# Patient Record
Sex: Female | Born: 2000 | Race: Black or African American | Hispanic: No | Marital: Single | Smoking: Never smoker
Health system: Southern US, Community
[De-identification: ages and names within clinical notes are randomized; demographics above are authoritative.]

## PROBLEM LIST (undated history)

## (undated) ENCOUNTER — Inpatient Hospital Stay (HOSPITAL_COMMUNITY): Payer: Self-pay

## (undated) DIAGNOSIS — Z789 Other specified health status: Secondary | ICD-10-CM

## (undated) DIAGNOSIS — F419 Anxiety disorder, unspecified: Secondary | ICD-10-CM

## (undated) DIAGNOSIS — S069XAA Unspecified intracranial injury with loss of consciousness status unknown, initial encounter: Secondary | ICD-10-CM

## (undated) DIAGNOSIS — F32A Depression, unspecified: Secondary | ICD-10-CM

## (undated) HISTORY — DX: Depression, unspecified: F32.A

## (undated) HISTORY — DX: Other specified health status: Z78.9

## (undated) HISTORY — DX: Anxiety disorder, unspecified: F41.9

## (undated) HISTORY — PX: NO PAST SURGERIES: SHX2092

---
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
## 2022-05-25 NOTE — Congregational Nurse Program (Signed)
Met with client this afternoon along with her 22 y.o. son Shanon Brow and a friend Ivin Booty who brought them here.  Ivin Booty got involved with this family several weeks ago after providing transportation for them to church.  Mom is of African descent but speaks [place] although difficult to understand her at times. Mom reports that she has a history of anxiety and depression.  States she has never taken medication for it. No other health needs noted at this time. Mom appears to have difficulty comprehending questions asked by nurse.  Her friend attempts to explain to her. Plan:  Nurse will check client's medical record to determine any other health history.  Nicolina Hirt D. Joneen Caraway MSN, Advice worker Nurse [employer] [phone]

## 2022-06-29 NOTE — Congregational Nurse Program (Signed)
Nurse stopped by client's room at the shelter to see how she and her son Shanon Brow were doing and to discuss feeding strategies.  Client states "he will just not eat anything" Nurse discussed maybe placing applesauce or some other food onto a Dorito chip to see if that helps.  Client seems really frustrated and has tried everything she states nothing works. Nurse encouraged client to be patient and gentle with Shanon Brow when coaxing him to eat and to not force it.  Client agrees and will continue to try. Client has no expressed needs of her own.  Nurse will follow up with client netx week.  Agapita Savarino D. Joneen Caraway MSN, Advice worker Nurse [employer] [phone]

## 2023-05-20 ENCOUNTER — Ambulatory Visit: Payer: Medicaid Other

## 2023-05-20 DIAGNOSIS — Z23 Encounter for immunization: Secondary | ICD-10-CM

## 2023-07-11 ENCOUNTER — Emergency Department (HOSPITAL_COMMUNITY)
Admission: EM | Admit: 2023-07-11 | Discharge: 2023-07-12 | Disposition: A | Discharge status: Home / Self Care | Payer: MEDICAID | Attending: Emergency Medicine | Admitting: Emergency Medicine

## 2023-07-11 DIAGNOSIS — R059 Cough, unspecified: Secondary | ICD-10-CM | POA: Diagnosis present

## 2023-07-11 DIAGNOSIS — J210 Acute bronchiolitis due to respiratory syncytial virus: Secondary | ICD-10-CM

## 2023-07-11 DIAGNOSIS — Z20822 Contact with and (suspected) exposure to covid-19: Secondary | ICD-10-CM | POA: Diagnosis not present

## 2023-07-11 DIAGNOSIS — B974 Respiratory syncytial virus as the cause of diseases classified elsewhere: Secondary | ICD-10-CM | POA: Insufficient documentation

## 2023-07-11 NOTE — ED Notes (Signed)
Per EMS, pt from home, c/o flu like symptoms, cough and fever X3 hours.  No OTC taken.  100/60 125HR  96%  [facility] prior to triage.

## 2023-07-11 NOTE — ED Notes (Signed)
Pt went to peds w/ child.  Will be triaged after child is taken care of.

## 2023-07-12 ENCOUNTER — Other Ambulatory Visit: Payer: Self-pay

## 2023-07-12 ENCOUNTER — Encounter (HOSPITAL_COMMUNITY): Payer: Self-pay

## 2023-07-12 LAB — RESP PANEL BY RT-PCR (RSV, FLU A&B, COVID)  RVPGX2
Influenza A by PCR: NEGATIVE
Influenza B by PCR: NEGATIVE
Resp Syncytial Virus by PCR: POSITIVE — AB
SARS Coronavirus 2 by RT PCR: NEGATIVE

## 2023-07-12 MED ORDER — ONDANSETRON 8 MG PO TBDP: 8.0000 mg | ORAL_TABLET | Freq: Three times a day (TID) | ORAL | 0 refills | Status: DC | PRN

## 2023-07-12 NOTE — Discharge Instructions (Signed)
Please take over-the-counter medicines for fever.  A prescription for nausea and spinning sent to your pharmacy. Return to the emergency department if you are having worsening symptoms, cannot tolerate fluids, become short of breath, or other new symptoms

## 2023-07-12 NOTE — ED Notes (Signed)
Pt was called x3 no answer for vitals.

## 2023-07-12 NOTE — ED Triage Notes (Signed)
 Pt POV with child in PEDS d/t flu-like s/s - cough, congestion, nausea.  Child has same s/s

## 2023-07-12 NOTE — ED Provider Notes (Signed)
 [place] EMERGENCY DEPARTMENT AT [facility] Provider Note   CSN: [id number] Arrival date & time: 07/11/23  2311     History  Chief Complaint  Patient presents with  . Cough    Teresa Baldwin is a 22 y.o. female.  HPI 22 year old female presents today with runny nose, cough, congestion, nausea.  Symptoms began last night with chills.  She has not noted any definite fever.  She has a preschool child with similar symptoms.  Denies any difficulty breathing, eating, chest pain, or dyspnea.  She is otherwise healthy and is not pregnant.    Home Medications Prior to Admission medications   Medication Sig Start Date End Date Taking? Authorizing Provider  docusate sodium  (COLACE) 100 MG capsule Take 1 capsule (100 mg total) by mouth 2 (two) times daily. 01/25/18   Degele, Mliss SQUIBB, MD  ferrous sulfate  300 (60 Fe) MG/5ML syrup Take 10 mLs (600 mg total) by mouth daily with breakfast. 11/02/17   Jerilynn Longs, NP  ferrous sulfate  300 (60 Fe) MG/5ML syrup Take 10 mLs (600 mg total) by mouth 2 (two) times daily with a meal. 12/21/17   Hipkins, Laymon SQUIBB, MD  ibuprofen  (ADVIL ,MOTRIN ) 100 MG/5ML suspension Take 30 mLs (600 mg total) by mouth every 6 (six) hours. 12/20/17   Hipkins, Laymon SQUIBB, MD  Prenatal Vit-Fe Fumarate-FA (PRENATAL MULTIVITAMIN) TABS tablet Take 1 tablet by mouth daily at 12 noon.    [provider]      Allergies    Patient has no known allergies.    Review of Systems   Review of Systems  Physical Exam Updated Vital Signs BP 99/78   Pulse (!) 104   Temp 98.5 F (36.9 C)   Resp 16   Ht 1.397 m (4' 7)   Wt 35.6 kg   LMP 07/05/2023   SpO2 97%   BMI 18.24 kg/m  Physical Exam Vitals reviewed.  HENT:     Head: Normocephalic.     Right Ear: External ear normal.     Left Ear: External ear normal.     Nose: Nose normal.     Mouth/Throat:     Pharynx: Oropharynx is clear.  Eyes:     Pupils: Pupils are equal, round, and reactive to  light.  Cardiovascular:     Rate and Rhythm: Normal rate and regular rhythm.     Pulses: Normal pulses.  Pulmonary:     Effort: Pulmonary effort is normal.     Breath sounds: Normal breath sounds.  Abdominal:     Palpations: Abdomen is soft.  Musculoskeletal:        General: Normal range of motion.     Cervical back: Normal range of motion.  Skin:    General: Skin is warm and dry.     Capillary Refill: Capillary refill takes less than 2 seconds.  Neurological:     General: No focal deficit present.     Mental Status: She is alert.  Psychiatric:        Mood and Affect: Mood normal.     ED Results / Procedures / Treatments   Labs (all labs ordered are listed, but only abnormal results are displayed) Labs Reviewed  RESP PANEL BY RT-PCR (RSV, FLU A&B, COVID)  RVPGX2 - Abnormal; Notable for the following components:      Result Value   Resp Syncytial Virus by PCR POSITIVE (*)    All other components within normal limits    EKG None  Radiology  No results found.  Procedures Procedures    Medications Ordered in ED Medications - No data to display  ED Course/ Medical Decision Making/ A&P Clinical Course as of 07/12/23 0835  Tue Jul 12, 2023  9164 Respiratory panel done and RSV positive [DR]    Clinical Course User Index [DR] Levander Houston, MD                                 Medical Decision Making  22 year old female with URI symptoms as well as some systemic symptoms.  Respiratory panel was done and patient has RSV.  She appears hemodynamically stable for discharge        Final Clinical Impression(s) / ED Diagnoses Final diagnoses:  None    Rx / DC Orders ED Discharge Orders     None         Levander Houston, MD 07/15/23 1031

## 2023-08-05 ENCOUNTER — Encounter: Payer: Self-pay | Admitting: Family Medicine

## 2023-08-05 ENCOUNTER — Ambulatory Visit: Payer: MEDICAID | Admitting: Family Medicine

## 2023-08-05 VITALS — BP 100/60 | HR 96 | Temp 98.1°F | Ht <= 58 in | Wt 121.0 lb

## 2023-08-05 DIAGNOSIS — Z7689 Persons encountering health services in other specified circumstances: Secondary | ICD-10-CM | POA: Insufficient documentation

## 2023-08-05 DIAGNOSIS — F32A Depression, unspecified: Secondary | ICD-10-CM

## 2023-08-05 DIAGNOSIS — R058 Other specified cough: Secondary | ICD-10-CM | POA: Diagnosis not present

## 2023-08-05 DIAGNOSIS — R0981 Nasal congestion: Secondary | ICD-10-CM

## 2023-08-05 DIAGNOSIS — E559 Vitamin D deficiency, unspecified: Secondary | ICD-10-CM | POA: Insufficient documentation

## 2023-08-05 DIAGNOSIS — F419 Anxiety disorder, unspecified: Secondary | ICD-10-CM

## 2023-08-05 DIAGNOSIS — F70 Mild intellectual disabilities: Secondary | ICD-10-CM

## 2023-08-05 MED ORDER — TRIAMCINOLONE ACETONIDE 40 MG/ML IJ SUSP: 40.0000 mg | Freq: Once | INTRAMUSCULAR | Status: DC

## 2023-08-05 MED ORDER — TRIAMCINOLONE ACETONIDE 40 MG/ML IJ SUSP
60.0000 mg | Freq: Once | INTRAMUSCULAR | Status: AC
  Administered 2023-08-05: 60 mg via INTRAMUSCULAR

## 2023-08-05 MED ORDER — BENZONATATE 100 MG PO CAPS: 100.0000 mg | ORAL_CAPSULE | Freq: Three times a day (TID) | ORAL | 0 refills | Status: DC | PRN

## 2023-08-05 MED ORDER — SERTRALINE HCL 25 MG PO TABS: 25.0000 mg | ORAL_TABLET | Freq: Every day | ORAL | 3 refills | Status: DC

## 2023-08-05 MED ORDER — TRIAMCINOLONE ACETONIDE 55 MCG/ACT NA AERO: 1.0000 | INHALATION_SPRAY | Freq: Two times a day (BID) | NASAL | 5 refills | Status: AC

## 2023-08-05 NOTE — Assessment & Plan Note (Signed)
Start zoloft  25 mg every day and referred to psychotherapy for counseling.

## 2023-08-05 NOTE — Progress Notes (Unsigned)
I,Jameka J Llittleton, CMA,acting as a Neurosurgeon for [employer], NP.,have documented all relevant documentation on the behalf of Ellender Hose, NP,as directed by  Ellender Hose, NP while in the presence of Ellender Hose, NP.  Subjective:  Patient ID: Teresa Baldwin , female    DOB: Aug 17, 2000 , 23 y.o.   MRN: [id number]  Chief Complaint  Patient presents with   Establish Care   URI    HPI  Patient is a 23 year old female who presents today to establish care. She would like to discuss nasal congestion and headaches.Patient also reports that she has not tried any medications for nasal congestion either, she denies any fever or body aches and states that no one around her is sick. Patient also states that she is very stressed with anxiety and depression, stating that she has a 76 year old son who is sick and on a feeding tube. Patient reports  that her family has not been very supportive of her and her child , so she moved out and had to live in a homeless shelter with her son for over 1 year before getting her own apartment, she states that she is currently unemployed.She would like to be referred for counseling and try medication for depression. Patient has a diagnosis of mild intellectual disorder, she is calm, able to answer question but constantly repeat some things.       Past Medical History:  Diagnosis Date   Anxiety    Depression    Medical history non-contributory      Family History  Problem Relation Age of Onset   Uterine cancer Mother    Hypertension Father      Current Outpatient Medications:    benzonatate (TESSALON PERLES) 100 MG capsule, Take 1 capsule (100 mg total) by mouth 3 (three) times daily as needed., Disp: 30 capsule, Rfl: 0   sertraline (ZOLOFT) 25 MG tablet, Take 1 tablet (25 mg total) by mouth daily., Disp: 30 tablet, Rfl: 3   triamcinolone (NASACORT) 55 MCG/ACT AERO nasal inhaler, Place 1 spray into the nose 2 (two) times daily., Disp: 60 each, Rfl: 5    docusate sodium (COLACE) 100 MG capsule, Take 1 capsule (100 mg total) by mouth 2 (two) times daily., Disp: 60 capsule, Rfl: 1   ferrous sulfate 300 (60 Fe) MG/5ML syrup, Take 10 mLs (600 mg total) by mouth daily with breakfast., Disp: 300 mL, Rfl: 1   ferrous sulfate 300 (60 Fe) MG/5ML syrup, Take 10 mLs (600 mg total) by mouth 2 (two) times daily with a meal., Disp: 150 mL, Rfl: 3   ondansetron (ZOFRAN-ODT) 8 MG disintegrating tablet, Take 1 tablet (8 mg total) by mouth every 8 (eight) hours as needed for nausea or vomiting., Disp: 20 tablet, Rfl: 0   Prenatal Vit-Fe Fumarate-FA (PRENATAL MULTIVITAMIN) TABS tablet, Take 1 tablet by mouth daily at 12 noon., Disp: , Rfl:    No Known Allergies   Review of Systems  Constitutional: Negative.   HENT:  Positive for congestion.   Eyes: Negative.   Respiratory:  Positive for cough.   Cardiovascular: Negative.   Genitourinary: Negative.   Musculoskeletal: Negative.   Skin: Negative.   Neurological:  Positive for headaches.  Psychiatric/Behavioral:  The patient is nervous/anxious.      Today's Vitals   08/05/23 1035 08/05/23 1115  BP: 100/60   Pulse: (!) 104 96  Temp: 98.1 F (36.7 C)   TempSrc: Oral   Weight: 121 lb (54.9 kg)  Height: 4\' 7"  (1.397 m)   PainSc: 10-Worst pain ever   PainLoc: Head    Body mass index is 28.12 kg/m.  Wt Readings from Last 3 Encounters:  08/05/23 121 lb (54.9 kg)  07/12/23 78 lb 7.7 oz (35.6 kg)  01/25/18 78 lb 8 oz (35.6 kg) (<1%, Z= -4.51)*   * Growth percentiles are based on CDC (Girls, 2-20 Years) data.    The ASCVD Risk score (Arnett DK, et al., 2019) failed to calculate for the following reasons:   The 2019 ASCVD risk score is only valid for ages 44 to 26  Objective:  Physical Exam HENT:     Head: Normocephalic.  Cardiovascular:     Rate and Rhythm: Normal rate and regular rhythm.  Pulmonary:     Effort: No respiratory distress.     Breath sounds: No rhonchi.  Abdominal:     General:  Bowel sounds are normal.  Skin:    General: Skin is warm and dry.  Neurological:     General: No focal deficit present.     Mental Status: She is alert and oriented to person, place, and time.  Psychiatric:        Mood and Affect: Mood normal.         Assessment And Plan:  Establishing care with new doctor, encounter for  Dry cough -     Benzonatate; Take 1 capsule (100 mg total) by mouth 3 (three) times daily as needed.  Dispense: 30 capsule; Refill: 0  Anxiety and depression Assessment & Plan: Start zoloft  25 mg every day and referred to psychotherapy for counseling.  Orders: -     Sertraline HCl; Take 1 tablet (25 mg total) by mouth daily.  Dispense: 30 tablet; Refill: 3 -     Ambulatory referral to Psychology -     CMP14+EGFR -     CBC with Differential/Platelet  Nasal sinus congestion -     Triamcinolone Acetonide; Place 1 spray into the nose 2 (two) times daily.  Dispense: 60 each; Refill: 5 -     Triamcinolone Acetonide  Vitamin D deficiency -     VITAMIN D 25 Hydroxy (Vit-D Deficiency, Fractures)  Intellectual developmental disorder, mild    Return in 6 weeks (on 09/16/2023), or if symptoms worsen or fail to improve, for Depression.  Patient was given opportunity to ask questions. Patient verbalized understanding of the plan and was able to repeat key elements of the plan. All questions were answered to their satisfaction.    I, Ellender Hose, NP, have reviewed all documentation for this visit. The documentation on 08/05/2023 for the exam, diagnosis, procedures, and orders are all accurate and complete.    IF YOU HAVE BEEN REFERRED TO A SPECIALIST, IT MAY TAKE 1-2 WEEKS TO SCHEDULE/PROCESS THE REFERRAL. IF YOU HAVE NOT HEARD FROM US/SPECIALIST IN TWO WEEKS, PLEASE GIVE [place] A CALL AT [phone].

## 2023-08-06 LAB — CBC WITH DIFFERENTIAL/PLATELET
Basophils Absolute: 0 10*3/uL (ref 0.0–0.2)
Basos: 0 %
EOS (ABSOLUTE): 0.1 10*3/uL (ref 0.0–0.4)
Eos: 1 %
Hematocrit: 36 % (ref 34.0–46.6)
Hemoglobin: 12 g/dL (ref 11.1–15.9)
Immature Grans (Abs): 0 10*3/uL (ref 0.0–0.1)
Immature Granulocytes: 0 %
Lymphocytes Absolute: 2.3 10*3/uL (ref 0.7–3.1)
Lymphs: 30 %
MCH: 27.8 pg (ref 26.6–33.0)
MCHC: 33.3 g/dL (ref 31.5–35.7)
MCV: 84 fL (ref 79–97)
Monocytes Absolute: 0.8 10*3/uL (ref 0.1–0.9)
Monocytes: 11 %
Neutrophils Absolute: 4.3 10*3/uL (ref 1.4–7.0)
Neutrophils: 58 %
Platelets: 309 10*3/uL (ref 150–450)
RBC: 4.31 x10E6/uL (ref 3.77–5.28)
RDW: 13 % (ref 11.7–15.4)
WBC: 7.5 10*3/uL (ref 3.4–10.8)

## 2023-08-06 LAB — CMP14+EGFR
ALT: 12 [IU]/L (ref 0–32)
AST: 20 [IU]/L (ref 0–40)
Albumin: 4.3 g/dL (ref 4.0–5.0)
Alkaline Phosphatase: 67 [IU]/L (ref 44–121)
BUN/Creatinine Ratio: 13 (ref 9–23)
BUN: 9 mg/dL (ref 6–20)
Bilirubin Total: <0.2 mg/dL (ref 0.0–1.2)
CO2: 24 mmol/L (ref 20–29)
Calcium: 9.7 mg/dL (ref 8.7–10.2)
Chloride: 98 mmol/L (ref 96–106)
Creatinine, Ser: 0.72 mg/dL (ref 0.57–1.00)
Globulin, Total: 3.6 g/dL (ref 1.5–4.5)
Glucose: 135 mg/dL — ABNORMAL HIGH (ref 70–99)
Potassium: 4 mmol/L (ref 3.5–5.2)
Sodium: 135 mmol/L (ref 134–144)
Total Protein: 7.9 g/dL (ref 6.0–8.5)
eGFR: 120 mL/min/{1.73_m2} (ref >59)

## 2023-08-06 LAB — VITAMIN D 25 HYDROXY (VIT D DEFICIENCY, FRACTURES): Vit D, 25-Hydroxy: 11.8 ng/mL — ABNORMAL LOW (ref 30.0–100.0)

## 2023-08-11 ENCOUNTER — Encounter: Payer: Self-pay | Admitting: Family Medicine

## 2023-08-11 DIAGNOSIS — F70 Mild intellectual disabilities: Secondary | ICD-10-CM | POA: Insufficient documentation

## 2023-08-11 MED ORDER — VITAMIN D (ERGOCALCIFEROL) 1.25 MG (50000 UNIT) PO CAPS: 50000.0000 [IU] | ORAL_CAPSULE | ORAL | 2 refills | Status: DC

## 2023-08-29 ENCOUNTER — Ambulatory Visit: Payer: MEDICAID | Admitting: Licensed Clinical Social Worker

## 2023-09-05 ENCOUNTER — Ambulatory Visit (INDEPENDENT_AMBULATORY_CARE_PROVIDER_SITE_OTHER): Payer: MEDICAID | Admitting: Licensed Clinical Social Worker

## 2023-09-05 DIAGNOSIS — F4329 Adjustment disorder with other symptoms: Secondary | ICD-10-CM | POA: Insufficient documentation

## 2023-09-05 DIAGNOSIS — F4323 Adjustment disorder with mixed anxiety and depressed mood: Secondary | ICD-10-CM | POA: Diagnosis not present

## 2023-09-05 NOTE — Progress Notes (Signed)
 [facility] Counselor/Therapist Progress Note  Patient ID: Teresa Baldwin, MRN: [id number]    Date: 09/05/23  Time Spent: 1100  am - 1200 am : 60 Minutes  Treatment Type: Individual Therapy.  Reported Symptoms: Symptoms of depression, anxiety, poor sleep, stress, excessive worry, financial issues and raising son alone.  Mental Status Exam: Appearance:  Disheveled     Behavior: Appropriate  Motor: Normal  Speech/Language:  Clear and Coherent  Affect: Appropriate  Mood: normal  Thought process: normal  Thought content:   WNL  Sensory/Perceptual disturbances:   WNL  Orientation: oriented to person, place, time/date, situation, day of week, month of year, and year  Attention: Good  Concentration: Good  Memory: WNL  Fund of knowledge:  Good  Insight:   Good  Judgment:  Good  Impulse Control: Fair   Risk Assessment: Danger to Self:  No Self-injurious Behavior: No Danger to Others: No Duty to Warn:no Physical Aggression / Violence:No  Access to Firearms a concern: No  Gang Involvement:No   Subjective:   Myriam Jacobson participated from office, located at [facility] with Clinician present.  Judianne consented to treatment.    Teresa Baldwin is a 23 year old [place] born female. Patient completed her [place] citizenship last year in November of 2024. Patient is here in the [place] with her son Onalee Hua who was born in the [place]. Onalee Hua is 24 years old and has medical conditions requiring him to be fed via a feeding tube. Patient reports a lack of family support although her father and sister live her in the [place]. Patient reports that her boyfriend is still in [place] and is working on trying to get here. Patient reports that one of her primary stressors is that she is a single parent raising her son on her own without her partner. Patient reports having financial issues and struggling at times to make ends meet. Patient reports seeing her Primary Doctor and keeping up with her  appointments and medications. Patient was placed on Zoloft in January to assist her with her symptoms of depression and anxiety. She states she can tell that it has decreased the severity of her symptoms. Patient is concerned about feeling better and not worrying so much and feeling overwhelmed. Patient reports that her son has a nurse that comes to the home Monday through Friday from 5 pm to 8 pm to assist with his needs. Patient reports that she would like to find a job to assist with their financial needs. Patient is pleasant and cooperative and motivated for treatment.  Interventions: Cognitive Behavioral Therapy Presenting Problem Chief Complaint: Stress from being a single parent, father is still in [place] and is working on getting to the [place]. Her parents are here but are working and she is alone raining her son.   What are the main stressors in your life right now, how long? Depression  3, Anxiety   3, Mood Swings  3, Appetite Change   3, Sleep Changes   3, Racing Thoughts   3, Loss of Interest   3, Irritability   3, Excessive Worrying   3, Low Energy   3, and Poor Concentration   3   Previous mental health services Have you ever been treated for a mental health problem, when, where, by whom? No     Are you currently seeing a therapist or counselor, counselor's name? No   Have you ever had a mental health hospitalization, how many times, length of stay? No   Have  you ever been treated with medication, name, reason, response? Yes Zoloft by PCP last month, helping but still having anxiety.  Have you ever had suicidal thoughts or attempted suicide, when, how? No NA  Risk factors for Suicide Demographic factors:  Adolescent or young adult Current mental status: No plan to harm self or others Loss factors: Financial problems/change in socioeconomic status Historical factors: NA Risk Reduction factors: Responsible for children under 68 years of age, Sense of responsibility to family,  Religious beliefs about death, and Living with another person, especially a relative Clinical factors:  Severe Anxiety and/or Agitation Depression:   NA Cognitive features that contribute to risk: NA    SUICIDE RISK:  Minimal: No identifiable suicidal ideation.  Patients presenting with no risk factors but with morbid ruminations; may be classified as minimal risk based on the severity of the depressive symptoms  Medical history Medical treatment and/or problems, explain: No NA Do you have any issues with chronic pain?  No NA Name of primary care physician/last physical exam: Dr. Dennie Bible NP January 2025  Allergies: No Medication, reactions? No medication allergies, patient reports taking medication makes her want to throw up.   Current medications:  benzonatate (TESSALON PERLES) 100 MG capsule 100 mg, 3 times daily PRN          docusate sodium (COLACE) 100 MG capsule 100 mg, 2 times daily         ferrous sulfate 300 (60 Fe) MG/5ML syrup 600 mg, Daily with breakfast         ferrous sulfate 300 (60 Fe) MG/5ML syrup 600 mg, 2 times daily with meals         ondansetron (ZOFRAN-ODT) 8 MG disintegrating tablet 8 mg, Every 8 hours PRN         Prenatal Vit-Fe Fumarate-FA (PRENATAL MULTIVITAMIN) TABS tablet 1 tablet, Daily         sertraline (ZOLOFT) 25 MG tablet 25 mg, Daily         triamcinolone (NASACORT) 55 MCG/ACT AERO nasal inhaler 1 spray, 2 times daily         Vitamin D, Ergocalciferol, (DRISDOL) 1.25 MG (50000 UNIT) CAPS capsule 50,000 Units, Every 7 days         Prescribed by: Dr. Dennie Bible NP Is there any history of mental health problems or substance abuse in your family, whom? No NA Has anyone in your family been hospitalized, who, where, length of stay? No NA  Social/family history Have you been married, how many times?  NOT married  Do you have children?  1  How many pregnancies have you had?  1  Who lives in your current household? Patient and her child-DAVID age  24.  Military history: No   Religious/spiritual involvement: [employer] What religion/faith base are you? Christian  Family of origin (childhood history)  Mom, Dad, Sister, and patient.  Where were you born? [place] Where did you grow up? [place] How many different homes have you lived? 4 Describe the atmosphere of the household where you grew up: Patient reports that it was not good. She wasn't allowed contact outside of home for safety.  Do you have siblings, step/half siblings, list names, relation, sex, age? Yes Sister-Stelle-married age 55  Are your parents separated/divorced, when and why? No Parents stayed married until mother passed away in 05/26/15.  Are your parents alive? Yes Father still alive.  Social supports (personal and professional): Patient reports that she doesn't feel that she has good support.  Education How many grades have you completed? high school diploma/GED Did you have any problems in school, what type? Yes learning difficulty Medications prescribed for these problems? No NA  Employment (financial issues): Unemployed and report financial Issues.   Legal history: No  legal issues   Trauma/Abuse history: Have you ever been exposed to any form of abuse, what type? No NA  Have you ever been exposed to something traumatic, describe? No NA  Substance use Do you use Caffeine? No Type, frequency? NA  Do you use Nicotine? No Type, frequency, ppd? NA   Do you use Alcohol? No Type, frequency? NA  How old were you went you first tasted alcohol? NA Was this accepted by your family? NA  When was your last drink, type, how much? NA  Have you ever used illicit drugs or taken more than prescribed, type, frequency, date of last usage? No NA  Diagnosis AXIS I Adjustment Disorder with Mixed Emotional Features  AXIS II No diagnosis  AXIS III @PMH @  AXIS IV economic problems, occupational problems, other psychosocial or environmental  problems, problems related to social environment, and problems with primary support group  AXIS V 51-60 moderate symptoms    Diagnosis: Adjustment Disorder with Mixed, depression and anxiety.  Individualized Treatment Plan Strengths: "I love singing and reading and meeting new people."  Supports: Patient denied having a good support system.   Goal/Needs for Treatment:  In order of importance to patient 1) "I want to feel better." 2) "I want to not be as anxious as I am." 3) "I would like to find a job."   Theatre manager of Needs: "I want to get my boyfriend here from [place] so that he can help care for our son, and I want to get a job and feel better."   Treatment Level:Moderate- Bi weekly  Symptoms:Depression, anxiety, loss of interest, poor sleep, poor appetite, excessive worry,   Client Treatment Preferences:Cognitive Behavioral Therapy   Healthcare consumer's goal for treatment:  Therapist Phyllis Ginger MSW, LCSW will support the patient's ability to achieve the goals identified. Cognitive Behavioral Therapy, Assertive Communication/Conflict Resolution Training, Relaxation Training, ACT, Humanistic and other evidenced-based practices will be used to promote progress towards healthy functioning.   Healthcare consumer will: Actively participate in therapy, working towards healthy functioning.    *Justification for Continuation/Discontinuation of Goal: R=Revised, O=Ongoing, A=Achieved, D=Discontinued  Goal 1)  Baseline date 09/05/2023: Progress towards goal "I wan't to feel better."; How Often - Daily Target Date Goal Was reviewed Status Code Progress towards goal/Likert rating  09/04/2024  O              Emotional Wellbeing: Identify and express emotions openly. Practice self-compassion and positive self-talk.  Learn healthy coping mechanisms for stress and difficult emotions.  Seek professional therapy to address underlying issues.  Physical Health: Establish a regular  sleep schedule.  Engage in regular physical activity, even if starting with short walks.  Maintain a balanced diet with nutritious foods.  Limit alcohol and substance use.  Social Connection: Reconnect with supportive friends and family members.  Consider joining a support group for people going through divorce.  Gradually expand social activities as comfort level allows.  Personal Growth: Explore new hobbies and interests.  Set small, achievable goals for personal development.  Learn stress management techniques like meditation or deep breathing.  Develop a positive outlook on the future.  Important considerations: Be patient with yourself: Healing takes time, and there will be ups and downs.  Seek professional help: Therapy can be crucial for managing depression and anxiety after a divorce.  Prioritize self-care: Make time for activities that nourish your mind and body.  Set realistic goals: Break down larger goals into smaller, manageable steps.  Communicate your needs: Let loved ones know how they can support you.   Goal 2)  Baseline date 09/05/2023: Progress towards goal "I don't wan to be as anxious as I am." ; How Often - Daily Target Date Goal Was reviewed Status Code Progress towards goal  09/04/2024  O             dentify and challenge negative thoughts: Try to find new, positive ways to look at a situation.  Practice self-compassion: Be kind to yourself and remember that you deserve to be nurtured.  Plan worry time: Set aside time each day to think about your anxieties or write them down.  Practice relaxation techniques: Try diaphragmatic breathing, simple stretches, guided imagery, or muscle tension release.  Exercise: Regular exercise can help reduce anxiety symptoms.  Eat well: Eat healthy, regular meals.  Get enough sleep: Stick to a sleep routine and avoid excess caffeine.  Spend time with others: Spend time with family and friends or do activities you enjoy.  Try the  butterfly hug: This technique combines breathing and bilateral stimulation to help lower anxiety.  This plan has been reviewed and created by the following participants:  This plan will be reviewed at least every 12 months. Goal 3)  Baseline date 2/24/205: Progress towards goal "I would like to find a job."; How Often - Daily Target Date Goal Was reviewed Status Code Progress towards goal  09/04/2024  O             Self-awareness and exploration: Identifying your strengths, weaknesses, and values to find a career fit.  Exploring past work experiences to understand what you enjoy and dislike about different roles.  Assessing your transferable skills and potential career paths.  Managing anxiety and stress: Developing coping mechanisms to manage job search related stress, like rejection, interview nerves, and self-doubt.  Identifying triggers that exacerbate anxiety and learning strategies to manage them.  Practicing relaxation techniques to alleviate stress  Communication skills development: Improving verbal and non-verbal communication skills for interviews  Crafting effective resumes and cover letters  Practicing responses to common interview questions  Building confidence and self-esteem: Challenging negative self-talk and beliefs about your ability to find a job  Identifying and addressing imposter syndrome  Developing a positive self-image to project confidence during the job search  Career decision making: Exploring different career options and making informed choices  Setting realistic job goals and developing a plan to achieve them  Identifying potential barriers to employment and developing strategies to overcome them  Addressing specific challenges: Managing mental health conditions that may impact job seeking  Dealing with past work experiences that might affect current job search  Addressing challenges related to networking or self-promotion  Date Behavioral Health Clinician  Date Guardian/Patient   09/05/2023  Phyllis Ginger MSW, LCSW 09/05/2023 Verbal Consent Provided    Phyllis Ginger MSW, LCSW/DATE 09/05/2023

## 2023-09-13 ENCOUNTER — Ambulatory Visit: Payer: MEDICAID | Admitting: Licensed Clinical Social Worker

## 2023-09-13 DIAGNOSIS — F4323 Adjustment disorder with mixed anxiety and depressed mood: Secondary | ICD-10-CM | POA: Diagnosis not present

## 2023-09-13 DIAGNOSIS — F4329 Adjustment disorder with other symptoms: Secondary | ICD-10-CM

## 2023-09-13 NOTE — Progress Notes (Signed)
 [facility] Counselor/Therapist Progress Note  Patient ID: Teresa Baldwin, MRN: [id number]    Date: 09/13/23  Time Spent: 1030  am - 1130 am : 60 Minutes  Treatment Type: Individual Therapy.  Reported Symptoms: Symptoms of depression, anxiety, poor sleep, stress, excessive worry, financial issues and raising son alone.   Mental Status Exam: Appearance:  Disheveled     Behavior: Appropriate  Motor: Normal  Speech/Language:  Clear and Coherent  Affect: Appropriate  Mood: normal  Thought process: normal  Thought content:   WNL  Sensory/Perceptual disturbances:   WNL  Orientation: oriented to person, place, time/date, situation, day of week, month of year, and year  Attention: Good  Concentration: Good  Memory: WNL  Fund of knowledge:  Good  Insight:   Good  Judgment:  Good  Impulse Control: Fair    Risk Assessment: Danger to Self:  No Self-injurious Behavior: No Danger to Others: No Duty to Warn:no Physical Aggression / Violence:No  Access to Firearms a concern: No  Gang Involvement:No    Subjective:    Teresa Baldwin participated from office, located at [facility] with Clinician present.  Teresa Baldwin consented to treatment.    Patient presented for her session early. She came and sat in the waiting area due to having to get a ride from her insurance company. Clinician called patient in early due to a cancellation. Patient remains concerned about her stress and her missing her partner. Patient reports that being a single parent is difficult and her son misses his father. Patient reports that she visited her father this past week. She said she walked to his home which is just a few streets away. Patient reports that the nice weather allowed for her and her son to get out and walk. Patient reports a lack of social support and how that leaves her feeling lonely.  Clinician validated patients feelings via active listening and verbal feedback. Clinician  discussed coping skills such as self-care while her son is at school and doing something that makes her happy. Clinician encouraged patient to make contact with some of the local organizations that Clinician provided information on during this session. Clinician provided information to the  [employer] here in [place], as well as [employer] Clinician processed with patient that they may be able to provide assistance for patient and her child as well as information on helping her partner get here to the [place]. Clinician also pointed out that this would be a good place to build some relationships.  Patient agreed to look over the information and continue to work on her coping skills to assist her in reaching her goals. Patient was pleasant and cooperative and engaged in session discussion.Patient is eager to gain tools to assist her in being a good parent and partner to her partner from [place].     Diagnosis: Adjustment Disorder with Mixed, depression and anxiety.   Individualized Treatment Plan Strengths: "I love singing and reading and meeting new people."  Supports: Patient denied having a good support system.    Goal/Needs for Treatment:  In order of importance to patient 1) "I want to feel better." 2) "I want to not be as anxious as I am." 3) "I would like to find a job."    Theatre manager of Needs: "I want to get my boyfriend here from [place] so that he can help care for our son, and I want to get a job and feel better."    Treatment Level:Moderate-  Bi weekly  Symptoms:Depression, anxiety, loss of interest, poor sleep, poor appetite, excessive worry,   Client Treatment Preferences:Cognitive Behavioral Therapy    Healthcare consumer's goal for treatment:   Therapist Phyllis Ginger MSW, LCSW will support the patient's ability to achieve the goals identified. Cognitive Behavioral Therapy, Assertive Communication/Conflict Resolution Training, Relaxation  Training, ACT, Humanistic and other evidenced-based practices will be used to promote progress towards healthy functioning.    Healthcare consumer will: Actively participate in therapy, working towards healthy functioning.     *Justification for Continuation/Discontinuation of Goal: R=Revised, O=Ongoing, A=Achieved, D=Discontinued   Goal 1)  Baseline date 09/05/2023: Progress towards goal "I wan't to feel better."; How Often - Daily Target Date Goal Was reviewed Status Code Progress towards goal/Likert rating  09/04/2024   O                       Emotional Wellbeing: Identify and express emotions openly. Practice self-compassion and positive self-talk.  Learn healthy coping mechanisms for stress and difficult emotions.  Seek professional therapy to address underlying issues.  Physical Health: Establish a regular sleep schedule.  Engage in regular physical activity, even if starting with short walks.  Maintain a balanced diet with nutritious foods.  Limit alcohol and substance use.  Social Connection: Reconnect with supportive friends and family members.  Consider joining a support group for people going through divorce.  Gradually expand social activities as comfort level allows.  Personal Growth: Explore new hobbies and interests.  Set small, achievable goals for personal development.  Learn stress management techniques like meditation or deep breathing.  Develop a positive outlook on the future.  Important considerations: Be patient with yourself: Healing takes time, and there will be ups and downs.  Seek professional help: Therapy can be crucial for managing depression and anxiety after a divorce.  Prioritize self-care: Make time for activities that nourish your mind and body.  Set realistic goals: Break down larger goals into smaller, manageable steps.  Communicate your needs: Let loved ones know how they can support you.    Goal 2)  Baseline date 09/05/2023: Progress towards  goal "I don't wan to be as anxious as I am." ; How Often - Daily Target Date Goal Was reviewed Status Code Progress towards goal  09/04/2024   O                      dentify and challenge negative thoughts: Try to find new, positive ways to look at a situation.  Practice self-compassion: Be kind to yourself and remember that you deserve to be nurtured.  Plan worry time: Set aside time each day to think about your anxieties or write them down.  Practice relaxation techniques: Try diaphragmatic breathing, simple stretches, guided imagery, or muscle tension release.  Exercise: Regular exercise can help reduce anxiety symptoms.  Eat well: Eat healthy, regular meals.  Get enough sleep: Stick to a sleep routine and avoid excess caffeine.  Spend time with others: Spend time with family and friends or do activities you enjoy.  Try the butterfly hug: This technique combines breathing and bilateral stimulation to help lower anxiety.  This plan has been reviewed and created by the following participants:  This plan will be reviewed at least every 12 months. Goal 3)  Baseline date 2/24/205: Progress towards goal "I would like to find a job."; How Often - Daily Target Date Goal Was reviewed Status Code Progress towards goal  09/04/2024  O                      Self-awareness and exploration: Identifying your strengths, weaknesses, and values to find a career fit.  Exploring past work experiences to understand what you enjoy and dislike about different roles.  Assessing your transferable skills and potential career paths.  Managing anxiety and stress: Developing coping mechanisms to manage job search related stress, like rejection, interview nerves, and self-doubt.  Identifying triggers that exacerbate anxiety and learning strategies to manage them.  Practicing relaxation techniques to alleviate stress  Communication skills development: Improving verbal and non-verbal communication skills for  interviews  Crafting effective resumes and cover letters  Practicing responses to common interview questions  Building confidence and self-esteem: Challenging negative self-talk and beliefs about your ability to find a job  Identifying and addressing imposter syndrome  Developing a positive self-image to project confidence during the job search  Career decision making: Exploring different career options and making informed choices  Setting realistic job goals and developing a plan to achieve them  Identifying potential barriers to employment and developing strategies to overcome them  Addressing specific challenges: Managing mental health conditions that may impact job seeking  Dealing with past work experiences that might affect current job search  Addressing challenges related to networking or self-promotion  Date Behavioral Health Clinician Date Guardian/Patient   09/05/2023             Phyllis Ginger MSW, LCSW 09/05/2023 Verbal Consent Provided     Interventions: Cognitive Behavioral Therapy and Solution-Oriented/Positive Psychology  Phyllis Ginger MSW, LCSW/DATE 09/13/2023

## 2023-09-16 ENCOUNTER — Ambulatory Visit: Payer: MEDICAID | Admitting: Family Medicine

## 2023-09-16 ENCOUNTER — Encounter: Payer: Self-pay | Admitting: Family Medicine

## 2023-09-16 VITALS — BP 110/70 | HR 89 | Ht <= 58 in | Wt 126.0 lb

## 2023-09-16 DIAGNOSIS — F70 Mild intellectual disabilities: Secondary | ICD-10-CM | POA: Diagnosis not present

## 2023-09-16 DIAGNOSIS — F331 Major depressive disorder, recurrent, moderate: Secondary | ICD-10-CM | POA: Diagnosis not present

## 2023-09-16 DIAGNOSIS — E559 Vitamin D deficiency, unspecified: Secondary | ICD-10-CM | POA: Diagnosis not present

## 2023-09-16 MED ORDER — VITAMIN D (ERGOCALCIFEROL) 1.25 MG (50000 UNIT) PO CAPS: 50000.0000 [IU] | ORAL_CAPSULE | ORAL | 2 refills | Status: AC

## 2023-09-16 MED ORDER — SERTRALINE HCL 50 MG PO TABS: 50.0000 mg | ORAL_TABLET | Freq: Every day | ORAL | 2 refills | Status: AC
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
## 2023-09-19 ENCOUNTER — Ambulatory Visit: Payer: MEDICAID | Admitting: Licensed Clinical Social Worker

## 2023-09-19 DIAGNOSIS — F4329 Adjustment disorder with other symptoms: Secondary | ICD-10-CM

## 2023-09-19 DIAGNOSIS — F4323 Adjustment disorder with mixed anxiety and depressed mood: Secondary | ICD-10-CM | POA: Diagnosis not present

## 2023-09-19 NOTE — Progress Notes (Signed)
 [facility] Counselor/Therapist Progress Note  Patient ID: Teresa Baldwin, MRN: [id number]    Date: 09/19/23  Time Spent: 1004  am - 1047 am : 43 Minutes  Treatment Type: Individual Therapy.  Reported Symptoms:  Symptoms of depression, anxiety, poor sleep, stress, excessive worry, financial issues and raising son alone.   Mental Status Exam: Appearance:  Disheveled     Behavior: Appropriate  Motor: Normal  Speech/Language:  Clear and Coherent  Affect: Appropriate  Mood: normal  Thought process: normal  Thought content:   WNL  Sensory/Perceptual disturbances:   WNL  Orientation: oriented to person, place, time/date, situation, day of week, month of year, and year  Attention: Good  Concentration: Good  Memory: WNL  Fund of knowledge:  Good  Insight:   Good  Judgment:  Good  Impulse Control: Fair    Risk Assessment: Danger to Self:  No Self-injurious Behavior: No Danger to Others: No Duty to Warn:no Physical Aggression / Violence:No  Access to Firearms a concern: No  Gang Involvement:No    Subjective:    Teresa Baldwin participated from office, located at [facility] with Clinician present.  Teresa Baldwin consented to treatment.    Teresa Baldwin presented for her session smiling and in a positive mood. Golden continues to be concerned about her babies father getting to [place] from [place]. Patient states that she has an interview at her old job and they said she was a good employee and she is eligible for rehire. Patient states she is hopeful of going back to work. She states that her boyfriend can be sponsored maybe by her if she is working. Patient is concerned about being here alone with her child.  Clinician actively listened and provided support to patient via verbal interaction. Clinician encouraged patient to reach Clinician encouraged patient to make contact with some of the local organizations that Clinician provided information on. Clinician  provided information to the [place] [employer] here in [place], as well as [employer] Clinician encouraged patient to utilize coping skills to assist her with reaching her goals.   Patient agreed to continue to reach out to the organizations for resources. Patient states she is going to the job interview. Patient identified that she needs her child's father here for both her son and herself for support. She states she wants to reach her goals and agrees to utilize coping skills.  Additional Coping Skills discussed: Stay Connected: Regular communication through video calls, messaging apps, and social media can help maintain relationships. Scheduling regular catch-ups can make you feel closer. Build a Scientist, clinical (histocompatibility and immunogenetics): Establishing friendships and connections in your new location can provide emotional support. Join local clubs, groups, or communities that share your interests. Create a Routine: Having a structured daily routine can provide a sense of normalcy and stability, which can be comforting when you're away from home. Explore Your New Environment: Engaging with your new surroundings can help distract from feelings of homesickness. Discover local culture, food, and activities to immerse yourself in your new life. Practice Self-Care: Prioritize your mental and physical well-being. Exercise, eat well, and take time for hobbies or activities that bring you joy. Reflect on Your Feelings: Acknowledge any feelings of loneliness or homesickness. Writing in a journal or talking to someone about your feelings can provide relief and clarity. Plan Visits: If possible, plan visits back home or invite family to visit you. Having something to look forward to can ease feelings of separation. Focus on the Positives: Remind yourself of the  opportunities and experiences you're gaining by living [place]. This perspective can help shift your mindset from what you're missing to what you're gaining.    Interventions: Cognitive Behavioral Therapy and Solution-Oriented/Positive Psychology  Diagnosis: Adjustment Disorder with mixed emotions  Diagnosis: Adjustment Disorder with Mixed, depression and anxiety.   Individualized Treatment Plan Strengths: "I love singing and reading and meeting new people."  Supports: Patient denied having a good support system.    Goal/Needs for Treatment:  In order of importance to patient 1) "I want to feel better." 2) "I want to not be as anxious as I am." 3) "I would like to find a job."    Theatre manager of Needs: "I want to get my boyfriend here from [place] so that he can help care for our son, and I want to get a job and feel better."    Treatment Level:Moderate- Bi weekly  Symptoms:Depression, anxiety, loss of interest, poor sleep, poor appetite, excessive worry,   Client Treatment Preferences:Cognitive Behavioral Therapy    Healthcare consumer's goal for treatment:   Therapist Phyllis Ginger MSW, LCSW will support the patient's ability to achieve the goals identified. Cognitive Behavioral Therapy, Assertive Communication/Conflict Resolution Training, Relaxation Training, ACT, Humanistic and other evidenced-based practices will be used to promote progress towards healthy functioning.    Healthcare consumer will: Actively participate in therapy, working towards healthy functioning.     *Justification for Continuation/Discontinuation of Goal: R=Revised, O=Ongoing, A=Achieved, D=Discontinued   Goal 1)  Baseline date 09/05/2023: Progress towards goal "I wan't to feel better."; How Often - Daily Target Date Goal Was reviewed Status Code Progress towards goal/Likert rating  09/04/2024   O                       Emotional Wellbeing: Identify and express emotions openly. Practice self-compassion and positive self-talk.  Learn healthy coping mechanisms for stress and difficult emotions.  Seek professional therapy to address underlying issues.   Physical Health: Establish a regular sleep schedule.  Engage in regular physical activity, even if starting with short walks.  Maintain a balanced diet with nutritious foods.  Limit alcohol and substance use.  Social Connection: Reconnect with supportive friends and family members.  Consider joining a support group for people going through divorce.  Gradually expand social activities as comfort level allows.  Personal Growth: Explore new hobbies and interests.  Set small, achievable goals for personal development.  Learn stress management techniques like meditation or deep breathing.  Develop a positive outlook on the future.  Important considerations: Be patient with yourself: Healing takes time, and there will be ups and downs.  Seek professional help: Therapy can be crucial for managing depression and anxiety after a divorce.  Prioritize self-care: Make time for activities that nourish your mind and body.  Set realistic goals: Break down larger goals into smaller, manageable steps.  Communicate your needs: Let loved ones know how they can support you.    Goal 2)  Baseline date 09/05/2023: Progress towards goal "I don't wan to be as anxious as I am." ; How Often - Daily Target Date Goal Was reviewed Status Code Progress towards goal  09/04/2024   O                      dentify and challenge negative thoughts: Try to find new, positive ways to look at a situation.  Practice self-compassion: Be kind to yourself and remember that you deserve to be nurtured.  Plan worry time: Set aside time each day to think about your anxieties or write them down.  Practice relaxation techniques: Try diaphragmatic breathing, simple stretches, guided imagery, or muscle tension release.  Exercise: Regular exercise can help reduce anxiety symptoms.  Eat well: Eat healthy, regular meals.  Get enough sleep: Stick to a sleep routine and avoid excess caffeine.  Spend time with others: Spend time with family  and friends or do activities you enjoy.  Try the butterfly hug: This technique combines breathing and bilateral stimulation to help lower anxiety.  This plan has been reviewed and created by the following participants:  This plan will be reviewed at least every 12 months. Goal 3)  Baseline date 2/24/205: Progress towards goal "I would like to find a job."; How Often - Daily Target Date Goal Was reviewed Status Code Progress towards goal  09/04/2024   O                      Self-awareness and exploration: Identifying your strengths, weaknesses, and values to find a career fit.  Exploring past work experiences to understand what you enjoy and dislike about different roles.  Assessing your transferable skills and potential career paths.  Managing anxiety and stress: Developing coping mechanisms to manage job search related stress, like rejection, interview nerves, and self-doubt.  Identifying triggers that exacerbate anxiety and learning strategies to manage them.  Practicing relaxation techniques to alleviate stress  Communication skills development: Improving verbal and non-verbal communication skills for interviews  Crafting effective resumes and cover letters  Practicing responses to common interview questions  Building confidence and self-esteem: Challenging negative self-talk and beliefs about your ability to find a job  Identifying and addressing imposter syndrome  Developing a positive self-image to project confidence during the job search  Career decision making: Exploring different career options and making informed choices  Setting realistic job goals and developing a plan to achieve them  Identifying potential barriers to employment and developing strategies to overcome them  Addressing specific challenges: Managing mental health conditions that may impact job seeking  Dealing with past work experiences that might affect current job search  Addressing challenges related to  networking or self-promotion  Date Behavioral Health Clinician Date Guardian/Patient   09/05/2023             Phyllis Ginger MSW, LCSW 09/05/2023 Verbal Consent Provided        Phyllis Ginger MSW, LCSW/DATE 09/19/2023

## 2023-09-25 NOTE — Assessment & Plan Note (Signed)
 Increase Sertraline to 50 mg every day.

## 2023-09-25 NOTE — Assessment & Plan Note (Signed)
 Speak to patient slowly and in simple language

## 2023-09-25 NOTE — Assessment & Plan Note (Signed)
 Take vit D supplement once Q weekly by mouth.

## 2023-09-27 ENCOUNTER — Ambulatory Visit: Payer: MEDICAID | Admitting: Licensed Clinical Social Worker

## 2023-09-27 DIAGNOSIS — F4323 Adjustment disorder with mixed anxiety and depressed mood: Secondary | ICD-10-CM | POA: Diagnosis not present

## 2023-09-27 DIAGNOSIS — F4329 Adjustment disorder with other symptoms: Secondary | ICD-10-CM

## 2023-09-27 DIAGNOSIS — F4325 Adjustment disorder with mixed disturbance of emotions and conduct: Secondary | ICD-10-CM | POA: Diagnosis not present

## 2023-09-27 NOTE — Progress Notes (Signed)
 [facility] Counselor/Therapist Progress Note  Patient ID: Teresa Baldwin, MRN: [id number]    Date: 09/27/23  Time Spent: 1104 am - 1135am : 31 Minutes  Treatment Type: Individual Therapy. Reported Symptoms:  Symptoms of depression, anxiety, poor sleep, stress, excessive worry, financial issues and raising son alone.   Mental Status Exam: Appearance:  Disheveled     Behavior: Appropriate  Motor: Normal  Speech/Language:  Clear and Coherent  Affect: Appropriate  Mood: normal  Thought process: normal  Thought content:   WNL  Sensory/Perceptual disturbances:   WNL  Orientation: oriented to person, place, time/date, situation, day of week, month of year, and year  Attention: Good  Concentration: Good  Memory: WNL  Fund of knowledge:  Good  Insight:   Good  Judgment:  Good  Impulse Control: Fair    Risk Assessment: Danger to Self:  No Self-injurious Behavior: No Danger to Others: No Duty to Warn:no Physical Aggression / Violence:No  Access to Firearms a concern: No  Gang Involvement:No    Subjective:    Teresa Baldwin participated from office, located at [facility] with Clinician present.  Teresa Baldwin consented to treatment.    Teresa Baldwin presented for her session reporting that the [place] [employer] here in [place] states that they would assist her in getting the documents for her fiancee to come but he would need to find a sponsor. Patient was preoccupied with the idea of getting her fiancee sponsored. Patient ask Clinician to find him a sponsor. Clinician reminded patient that she had provided resources but could not directly sponsor or  ask someone to sponsor him. Clinician encouraged patient to discuss with her family that is here and have them assist her in developing a plan for sponsorship. Clinician also encouraged patient to look for work so that she could possibly serve as his sponsor.  Patient appeared disappointed and wanted  Clinician to speak with her fiancee. Clinician informed patient that it would be better for her to share her information with her fiancee as Clinician was her therapist and could only provide her with resources and not be involved in getting her fiancee to the [place]. Patient did agree to speak with the [place] organization again and continue to work with them to gain additional insight into what she can do to ger her fiancee to the [place] as well as check if they can assist her in finding employment. Patient will continue to utilize coping skills. Patient will continue with weekly therapy sessions and treatment plan will be reviewed by 09/04/2024.  Additional Coping Skills discussed: Stay Connected: Regular communication through video calls, messaging apps, and social media can help maintain relationships. Scheduling regular catch-ups can make you feel closer. Build a Scientist, clinical (histocompatibility and immunogenetics): Establishing friendships and connections in your new location can provide emotional support. Join local clubs, groups, or communities that share your interests. Create a Routine: Having a structured daily routine can provide a sense of normalcy and stability, which can be comforting when you're away from home. Explore Your New Environment: Engaging with your new surroundings can help distract from feelings of homesickness. Discover local culture, food, and activities to immerse yourself in your new life. Practice Self-Care: Prioritize your mental and physical well-being. Exercise, eat well, and take time for hobbies or activities that bring you joy. Reflect on Your Feelings: Acknowledge any feelings of loneliness or homesickness. Writing in a journal or talking to someone about your feelings can provide relief and clarity. Plan Visits: If possible, plan visits back home  or invite family to visit you. Having something to look forward to can ease feelings of separation. Focus on the Positives: Remind yourself of the opportunities and  experiences you're gaining by living abroad. This perspective can help shift your mindset from what you're missing to what you're gaining.   Interventions: Cognitive Behavioral Therapy and Solution-Oriented/Positive Psychology   Diagnosis: Adjustment Disorder with mixed emotions   Diagnosis: Adjustment Disorder with Mixed, depression and anxiety.   Individualized Treatment Plan Strengths: "I love singing and reading and meeting new people."  Supports: Patient denied having a good support system.    Goal/Needs for Treatment:  In order of importance to patient 1) "I want to feel better." 2) "I want to not be as anxious as I am." 3) "I would like to find a job."    Theatre manager of Needs: "I want to get my boyfriend here from [place] so that he can help care for our son, and I want to get a job and feel better."    Treatment Level:Moderate- Bi weekly  Symptoms:Depression, anxiety, loss of interest, poor sleep, poor appetite, excessive worry,   Client Treatment Preferences:Cognitive Behavioral Therapy    Healthcare consumer's goal for treatment:   Therapist Phyllis Ginger MSW, LCSW will support the patient's ability to achieve the goals identified. Cognitive Behavioral Therapy, Assertive Communication/Conflict Resolution Training, Relaxation Training, ACT, Humanistic and other evidenced-based practices will be used to promote progress towards healthy functioning.    Healthcare consumer will: Actively participate in therapy, working towards healthy functioning.     *Justification for Continuation/Discontinuation of Goal: R=Revised, O=Ongoing, A=Achieved, D=Discontinued   Goal 1)  Baseline date 09/05/2023: Progress towards goal "I wan't to feel better."; How Often - Daily Target Date Goal Was reviewed Status Code Progress towards goal/Likert rating  09/04/2024   O                       Emotional Wellbeing: Identify and express emotions openly. Practice self-compassion and positive  self-talk.  Learn healthy coping mechanisms for stress and difficult emotions.  Seek professional therapy to address underlying issues.  Physical Health: Establish a regular sleep schedule.  Engage in regular physical activity, even if starting with short walks.  Maintain a balanced diet with nutritious foods.  Limit alcohol and substance use.  Social Connection: Reconnect with supportive friends and family members.  Consider joining a support group for people going through divorce.  Gradually expand social activities as comfort level allows.  Personal Growth: Explore new hobbies and interests.  Set small, achievable goals for personal development.  Learn stress management techniques like meditation or deep breathing.  Develop a positive outlook on the future.  Important considerations: Be patient with yourself: Healing takes time, and there will be ups and downs.  Seek professional help: Therapy can be crucial for managing depression and anxiety after a divorce.  Prioritize self-care: Make time for activities that nourish your mind and body.  Set realistic goals: Break down larger goals into smaller, manageable steps.  Communicate your needs: Let loved ones know how they can support you.    Goal 2)  Baseline date 09/05/2023: Progress towards goal "I don't wan to be as anxious as I am." ; How Often - Daily Target Date Goal Was reviewed Status Code Progress towards goal  09/04/2024   O                      dentify and challenge negative  thoughts: Try to find new, positive ways to look at a situation.  Practice self-compassion: Be kind to yourself and remember that you deserve to be nurtured.  Plan worry time: Set aside time each day to think about your anxieties or write them down.  Practice relaxation techniques: Try diaphragmatic breathing, simple stretches, guided imagery, or muscle tension release.  Exercise: Regular exercise can help reduce anxiety symptoms.  Eat well: Eat  healthy, regular meals.  Get enough sleep: Stick to a sleep routine and avoid excess caffeine.  Spend time with others: Spend time with family and friends or do activities you enjoy.  Try the butterfly hug: This technique combines breathing and bilateral stimulation to help lower anxiety.  This plan has been reviewed and created by the following participants:  This plan will be reviewed at least every 12 months. Goal 3)  Baseline date 2/24/205: Progress towards goal "I would like to find a job."; How Often - Daily Target Date Goal Was reviewed Status Code Progress towards goal  09/04/2024   O                      Self-awareness and exploration: Identifying your strengths, weaknesses, and values to find a career fit.  Exploring past work experiences to understand what you enjoy and dislike about different roles.  Assessing your transferable skills and potential career paths.  Managing anxiety and stress: Developing coping mechanisms to manage job search related stress, like rejection, interview nerves, and self-doubt.  Identifying triggers that exacerbate anxiety and learning strategies to manage them.  Practicing relaxation techniques to alleviate stress  Communication skills development: Improving verbal and non-verbal communication skills for interviews  Crafting effective resumes and cover letters  Practicing responses to common interview questions  Building confidence and self-esteem: Challenging negative self-talk and beliefs about your ability to find a job  Identifying and addressing imposter syndrome  Developing a positive self-image to project confidence during the job search  Career decision making: Exploring different career options and making informed choices  Setting realistic job goals and developing a plan to achieve them  Identifying potential barriers to employment and developing strategies to overcome them  Addressing specific challenges: Managing mental health  conditions that may impact job seeking  Dealing with past work experiences that might affect current job search  Addressing challenges related to networking or self-promotion  Date Behavioral Health Clinician Date Guardian/Patient   09/05/2023             Phyllis Ginger MSW, LCSW 09/05/2023 Verbal Consent Provided           Phyllis Ginger MSW, LCSW/DATE 09/27/2023

## 2023-10-06 ENCOUNTER — Ambulatory Visit: Payer: MEDICAID | Admitting: Licensed Clinical Social Worker

## 2023-10-10 ENCOUNTER — Ambulatory Visit: Payer: MEDICAID | Admitting: Licensed Clinical Social Worker

## 2023-10-25 ENCOUNTER — Ambulatory Visit: Payer: MEDICAID | Admitting: Licensed Clinical Social Worker

## 2023-11-07 ENCOUNTER — Ambulatory Visit: Payer: MEDICAID | Admitting: Licensed Clinical Social Worker

## 2023-11-29 ENCOUNTER — Ambulatory Visit (INDEPENDENT_AMBULATORY_CARE_PROVIDER_SITE_OTHER): Payer: MEDICAID | Admitting: Licensed Clinical Social Worker

## 2023-11-29 DIAGNOSIS — F341 Dysthymic disorder: Secondary | ICD-10-CM | POA: Diagnosis not present

## 2023-11-29 DIAGNOSIS — F419 Anxiety disorder, unspecified: Secondary | ICD-10-CM | POA: Diagnosis not present

## 2023-11-29 NOTE — Progress Notes (Addendum)
 [facility] Counselor Initial Adult Exam  Name: Teresa Baldwin Date: 11/29/2023 MRN: [id number] DOB: 2000-11-25 PCP: Melodie Spry, NP  Time Spent: 10:15  am - 10:57 am : 42 Minutes  Guardian/Payee:  Self/Adult    Paperwork requested: No   Reason for Visit /Presenting Problem: some anxiety and depression being managed with medication  Mental Status Exam: Appearance:   Well Groomed     Behavior:  Sharing  Motor:  Normal  Speech/Language:   Normal Rate  Affect:  Appropriate  Mood:  normal  Thought process:  normal  Thought content:    WNL  Sensory/Perceptual disturbances:    WNL  Orientation:  oriented to person, place, and time/date  Attention:  Good  Concentration:  Good  Memory:  WNL  Fund of knowledge:   Good  Insight:    Good  Judgment:   Good  Impulse Control:  Good   Reported Symptoms:  some anxiety and managing depression for the last two years, single mother weight of responsibility   Risk Assessment: Danger to Self:  No Self-injurious Behavior: No Danger to Others: No Duty to Warn:no Physical Aggression / Violence:No  Access to Firearms a concern: No  Gang Involvement:No  Patient / guardian was educated about steps to take if suicide or homicide risk level increases between visits: yes While future psychiatric events cannot be accurately predicted, the patient does not currently require acute inpatient psychiatric care and does not currently meet [place]  involuntary commitment criteria.  Substance Abuse History: Current substance abuse: No     Caffeine: Tobacco: Alcohol: Substance use:  Past Psychiatric History:   Previous psychological history is significant for anxiety and depression Outpatient Providers:Taking meds for anxiety and depression for the last two years, and seeing [facility] at [facility] History of Psych Hospitalization: No  Psychological Testing: Memory:  Head injury and diagnosed in [place] as a child   Abuse History:   Victim of: No., N/A   Report needed: No. Victim of Neglect:No. Perpetrator of None  Witness / Exposure to Domestic Violence: No   Protective Services Involvement: Yes  For her son CPS call but no incident case closed about two years ago Witness to [employer] Violence:  No   Family History:  Family History  Problem Relation Age of Onset   Uterine cancer Mother    Hypertension Father     Living situation: the patient lives alone  Sexual Orientation: Straight  Relationship Status: single  Name of spouse / other:N/A If a parent, number of children / ages:5  Support Systems: lives alone  Surveyor, quantity Stress:  Yes   Income/Employment/Disability: No income  Financial planner: No   Educational History: Education: high school diploma/GED  Religion/Sprituality/World View: Christian  Any cultural differences that may affect / interfere with treatment:  not applicable   Recreation/Hobbies: music and reading   Stressors: Financial difficulties   Health problems   Marital or family conflict  with family members here based on cultural   Strengths: Spirituality  Barriers:  Financial difficulties-not working    Legal History: Pending legal issue / charges: The patient has no significant history of legal issues. History of legal issue / charges: None  Medical History/Surgical History: not reviewed Past Medical History:  Diagnosis Date   Anxiety    Depression    Medical history non-contributory     Past Surgical History:  Procedure Laterality Date   CESAREAN SECTION N/A 12/18/2017   Procedure: CESAREAN SECTION;  Surgeon: Tresia Fruit, MD;  Location:  [facility];  Service: Obstetrics;  Laterality: N/A;   NO PAST SURGERIES      Medications: Current Outpatient Medications  Medication Sig Dispense Refill   benzonatate  (TESSALON  PERLES) 100 MG capsule Take 1 capsule (100 mg total) by mouth 3 (three) times daily as needed. 30 capsule 0   docusate sodium  (COLACE)  100 MG capsule Take 1 capsule (100 mg total) by mouth 2 (two) times daily. 60 capsule 1   ferrous sulfate  300 (60 Fe) MG/5ML syrup Take 10 mLs (600 mg total) by mouth daily with breakfast. 300 mL 1   ferrous sulfate  300 (60 Fe) MG/5ML syrup Take 10 mLs (600 mg total) by mouth 2 (two) times daily with a meal. 150 mL 3   ondansetron  (ZOFRAN -ODT) 8 MG disintegrating tablet Take 1 tablet (8 mg total) by mouth every 8 (eight) hours as needed for nausea or vomiting. 20 tablet 0   sertraline  (ZOLOFT ) 50 MG tablet Take 1 tablet (50 mg total) by mouth daily. 90 tablet 2   triamcinolone  (NASACORT ) 55 MCG/ACT AERO nasal inhaler Place 1 spray into the nose 2 (two) times daily. 60 each 5   Vitamin D , Ergocalciferol , (DRISDOL ) 1.25 MG (50000 UNIT) CAPS capsule Take 1 capsule (50,000 Units total) by mouth every 7 (seven) days. 4 capsule 2   No current facility-administered medications for this visit.    No Known Allergies  Diagnoses:  Anxiety and Persistent Depressive Disorder  Psychiatric Treatment: No , N/A  Plan of Care: In person  Narrative:  Selma Dallas participated from office with therapist and consented to treatment. We reviewed the limits of confidentiality prior to the start of the evaluation. Retina Bernardy expressed understanding and agreement to proceed. Cultural family conflict ([place]), child's father is in [place] and not able to get here at this time. She is hoping he will be her by the end of the year but has been waiting 6 years since birthing her child here.  Young single mother and estranged from siblings because of cultural shame of being a teen mother. Mother passed away almost 10 years ago and father put her out a few years ago.  Went from shelter and not has her own apt, child has health issues and she also has an LD and unemployed.  Financial difficulties and not familiar with what [place]  has to offer in support.  Wants therapy to manage her anxiety and depression and is  also taking medication to manage this.  Patient was late to appointment as she was outside of the office after checking in, staff was unable to locate until she returned to the building.  Discussed waiting in the patient waiting area to avoid delay in visits for future sessions.  Patient agreed.      A follow-up was scheduled to create a treatment plan and begin treatment. Therapist answered  and all questions during the evaluation and contact information was provided.    Grandville Lax, LCMHC

## 2023-12-20 ENCOUNTER — Ambulatory Visit: Payer: MEDICAID | Admitting: Licensed Clinical Social Worker

## 2024-01-24 ENCOUNTER — Ambulatory Visit (INDEPENDENT_AMBULATORY_CARE_PROVIDER_SITE_OTHER): Payer: MEDICAID

## 2024-01-24 ENCOUNTER — Ambulatory Visit (HOSPITAL_COMMUNITY)
Admission: EM | Admit: 2024-01-24 | Discharge: 2024-01-24 | Disposition: A | Discharge status: Home / Self Care | Payer: MEDICAID | Attending: Emergency Medicine | Admitting: Emergency Medicine

## 2024-01-24 ENCOUNTER — Encounter (HOSPITAL_COMMUNITY): Payer: Self-pay | Admitting: Emergency Medicine

## 2024-01-24 DIAGNOSIS — M25562 Pain in left knee: Secondary | ICD-10-CM

## 2024-01-24 DIAGNOSIS — W19XXXA Unspecified fall, initial encounter: Secondary | ICD-10-CM | POA: Diagnosis not present

## 2024-01-24 DIAGNOSIS — T148XXA Other injury of unspecified body region, initial encounter: Secondary | ICD-10-CM | POA: Diagnosis not present

## 2024-01-24 MED ORDER — IBUPROFEN 600 MG PO TABS: 600.0000 mg | ORAL_TABLET | Freq: Four times a day (QID) | ORAL | 0 refills | Status: AC | PRN

## 2024-01-24 MED ORDER — MUPIROCIN 2 % EX OINT: 1.0000 | TOPICAL_OINTMENT | Freq: Two times a day (BID) | CUTANEOUS | 0 refills | Status: AC

## 2024-01-24 MED ORDER — KETOROLAC TROMETHAMINE 30 MG/ML IJ SOLN
INTRAMUSCULAR | Status: AC
  Filled 2024-01-24: qty 1

## 2024-01-24 MED ORDER — KETOROLAC TROMETHAMINE 30 MG/ML IJ SOLN
30.0000 mg | Freq: Once | INTRAMUSCULAR | Status: AC
  Administered 2024-01-24: 30 mg via INTRAMUSCULAR

## 2024-01-24 NOTE — ED Provider Notes (Signed)
 [facility]    CSN: [id number] Arrival date & time: 01/24/24  1744      History   Chief Complaint Chief Complaint  Patient presents with   Fall    HPI Teresa Baldwin is a 23 y.o. female.   Patient presents with left knee pain after fall that occurred prior to arrival.  Patient states that she was trying to walk quickly in the rain and slipped and fell landing on her left knee.  Patient denies any other injuries from this fall.  Patient denies hitting her head or loss of consciousness.  Patient denies taking anything for her pain.  The history is provided by the patient and medical records.  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   Vitamin D  deficiency 08/05/2023   Nasal sinus congestion 08/05/2023   Anxiety and depression 08/05/2023   Dry cough 08/05/2023   Establishing care with new doctor, encounter for 08/05/2023   Status post primary low transverse cesarean section 12/18/2017   Uterine fibroid 12/18/2017   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 Authorizing Provider  ibuprofen  (ADVIL ) 600 MG tablet Take 1 tablet (600  mg total) by mouth every 6 (six) hours as needed. 01/24/24  Yes Johnie Flaming A, NP  mupirocin  ointment (BACTROBAN ) 2 % Apply 1 Application topically 2 (two) times daily. 01/24/24  Yes Johnie, Khye Hochstetler A, NP  benzonatate  (TESSALON  PERLES) 100 MG capsule Take 1 capsule (100 mg total) by mouth 3 (three) times daily as needed. 08/05/23 08/04/24  Petrina Pries, NP  docusate sodium  (COLACE) 100 MG capsule Take 1 capsule (100 mg total) by mouth 2 (two) times daily. 01/25/18   Degele, Mliss SQUIBB, MD  ferrous sulfate  300 (60 Fe) MG/5ML syrup Take 10 mLs (600 mg total) by mouth daily with breakfast. 11/02/17   Jerilynn Longs, NP  ferrous sulfate  300 (60 Fe) MG/5ML syrup Take 10 mLs (600 mg total) by mouth 2 (two) times daily with a meal. 12/21/17   Hipkins, Laymon SQUIBB, MD  ondansetron  (ZOFRAN -ODT) 8 MG disintegrating tablet Take 1 tablet (8 mg total) by mouth every 8 (eight) hours as needed for nausea or vomiting. 07/12/23   Levander Houston, MD  sertraline  (ZOLOFT ) 50 MG tablet Take 1 tablet (50 mg total) by mouth daily. 09/16/23 06/12/24  Petrina Pries, NP  triamcinolone  (NASACORT ) 55 MCG/ACT AERO nasal inhaler Place 1  spray into the nose 2 (two) times daily. 08/05/23 02/01/24  Petrina Pries, NP  Vitamin D , Ergocalciferol , (DRISDOL ) 1.25 MG (50000 UNIT) CAPS capsule Take 1 capsule (50,000 Units total) by mouth every 7 (seven) days. 09/16/23   Petrina Pries, NP    Family History Family History  Problem Relation Age of Onset   Uterine cancer Mother    Hypertension Father     Social History Social History   Tobacco Use   Smoking status: Never    Passive exposure: Never   Smokeless tobacco: Never  Vaping Use   Vaping status: Never Used  Substance Use Topics   Alcohol use: Never   Drug use: Never     Allergies   Patient has no known allergies.   Review of Systems Review of Systems  Per HPI  Physical Exam Triage Vital Signs ED Triage Vitals  Encounter Vitals Group     BP 01/24/24 1811 102/71     Girls  Systolic BP Percentile --      Girls Diastolic BP Percentile --      Boys Systolic BP Percentile --      Boys Diastolic BP Percentile --      Pulse Rate 01/24/24 1811 88     Resp 01/24/24 1811 18     Temp --      Temp src --      SpO2 01/24/24 1811 96 %     Weight --      Height --      Head Circumference --      Peak Flow --      Pain Score 01/24/24 1810 10     Pain Loc --      Pain Education --      Exclude from Growth Chart --    No data found.  Updated Vital Signs BP 102/71 (BP Location: Left Arm)   Pulse 88   Resp 18   LMP 01/20/2024 (Exact Date)   SpO2 96%   Visual Acuity Right Eye Distance:   Left Eye Distance:   Bilateral Distance:    Right Eye Near:   Left Eye Near:    Bilateral Near:     Physical Exam Vitals and nursing note reviewed.  Constitutional:      General: She is awake. She is not in acute distress.    Appearance: Normal appearance. She is well-developed and well-groomed. She is not ill-appearing.  Musculoskeletal:     Left knee: Swelling and bony tenderness present. No deformity. Decreased range of motion. Tenderness present over the medial joint line, lateral joint line and LCL.  Skin:    Findings: Abrasion present.     Comments: Approximately 3 cm x 4 cm superficial abrasion noted to left knee  Neurological:     Mental Status: She is alert.  Psychiatric:        Behavior: Behavior is cooperative.      UC Treatments / Results  Labs (all labs ordered are listed, but only abnormal results are displayed) Labs Reviewed - No data to display  EKG   Radiology DG Knee Complete 4 Views Left Result Date: 01/24/2024 CLINICAL DATA:  left knee pain after fall EXAM: LEFT KNEE - COMPLETE 4+ VIEW COMPARISON:  None Available. FINDINGS: No acute fracture or dislocation. No joint effusion. There is no evidence of arthropathy or other focal bone abnormality. Soft tissues are unremarkable. IMPRESSION: No acute fracture or dislocation. Electronically  Signed   By: Rogelia Carlean HERO.D.  On: 01/24/2024 18:48    Procedures Procedures (including critical care time)  Medications Ordered in UC Medications  ketorolac  (TORADOL ) 30 MG/ML injection 30 mg (30 mg Intramuscular Given 01/24/24 1830)    Initial Impression / Assessment and Plan / UC Course  I have reviewed the triage vital signs and the nursing notes.  Pertinent labs & imaging results that were available during my care of the patient were reviewed by me and considered in my medical decision making (see chart for details).     Patient is overall well-appearing.  Vitals are stable.  X-rays ordered.  Based on my interpretation there is no acute fracture or dislocation noted on x-ray.  Radiology report confirms this.  Given IM Toradol  in clinic for acute pain.  Prescribed ibuprofen  as needed for pain and recommend alternating this with Tylenol  provided patient with knee brace.  Provided basic wound care in clinic.  Prescribe mupirocin  ointment for infection prevention related to abrasion.  Given orthopedic follow-up.  Discussed follow-up and return precautions. Final Clinical Impressions(s) / UC Diagnoses   Final diagnoses:  Acute pain of left knee  Fall, initial encounter  Abrasion     Discharge Instructions      Your x-ray is negative for any underlying fracture or dislocation.  It is likely that you have sprained or bruised your knee from this fall. Wear brace as needed for comfort and to help reduce swelling. You can take 600 mg ibuprofen  every 6 hours as needed for pain.  Alternate this with 650 mg of Tylenol  every 6-8 hours as needed for breakthrough pain. Alternate between ice and heat to help reduce swelling and to help with pain.  Rest and elevate your leg often. Apply mupirocin  ointment to the wound on your knee twice daily.  Keep this area clean and dry to avoid infection. Follow-up with [facility] for further evaluation of your knee pain if it  continues. Otherwise follow-up with your primary care provider or return here as needed.     ED Prescriptions     Medication Sig Dispense Auth. Provider   mupirocin  ointment (BACTROBAN ) 2 % Apply 1 Application topically 2 (two) times daily. 22 g Johnie Flaming A, NP   ibuprofen  (ADVIL ) 600 MG tablet Take 1 tablet (600 mg total) by mouth every 6 (six) hours as needed. 30 tablet Johnie Flaming A, NP      PDMP not reviewed this encounter.   Johnie Flaming A, NP 01/24/24 1919

## 2024-01-24 NOTE — Discharge Instructions (Addendum)
 Your x-ray is negative for any underlying fracture or dislocation.  It is likely that you have sprained or bruised your knee from this fall. Wear brace as needed for comfort and to help reduce swelling. You can take 600 mg ibuprofen  every 6 hours as needed for pain.  Alternate this with 650 mg of Tylenol  every 6-8 hours as needed for breakthrough pain. Alternate between ice and heat to help reduce swelling and to help with pain.  Rest and elevate your leg often. Apply mupirocin  ointment to the wound on your knee twice daily.  Keep this area clean and dry to avoid infection. Follow-up with [facility] for further evaluation of your knee pain if it continues. Otherwise follow-up with your primary care provider or return here as needed.

## 2024-01-24 NOTE — ED Triage Notes (Signed)
 Pt reports fell in the rain while ago and landed on left knee.

## 2024-01-26 ENCOUNTER — Encounter (HOSPITAL_COMMUNITY): Payer: Self-pay

## 2024-01-26 ENCOUNTER — Other Ambulatory Visit: Payer: Self-pay

## 2024-01-26 ENCOUNTER — Emergency Department (HOSPITAL_COMMUNITY)
Admission: EM | Admit: 2024-01-26 | Discharge: 2024-01-26 | Disposition: A | Discharge status: Home / Self Care | Payer: MEDICAID | Attending: Student | Admitting: Student

## 2024-01-26 DIAGNOSIS — M25562 Pain in left knee: Secondary | ICD-10-CM | POA: Diagnosis present

## 2024-01-26 DIAGNOSIS — W19XXXA Unspecified fall, initial encounter: Secondary | ICD-10-CM | POA: Diagnosis not present

## 2024-01-26 DIAGNOSIS — M25569 Pain in unspecified knee: Secondary | ICD-10-CM

## 2024-01-26 MED ORDER — KETOROLAC TROMETHAMINE 15 MG/ML IJ SOLN
15.0000 mg | Freq: Once | INTRAMUSCULAR | Status: AC
  Filled 2024-01-26: qty 1

## 2024-01-26 MED ORDER — MELOXICAM 15 MG PO TABS: 15.0000 mg | ORAL_TABLET | Freq: Every day | ORAL | 0 refills | Status: AC

## 2024-01-31 ENCOUNTER — Encounter (HOSPITAL_COMMUNITY): Payer: Self-pay | Admitting: Emergency Medicine

## 2024-02-02 ENCOUNTER — Ambulatory Visit: Payer: MEDICAID | Admitting: Family Medicine

## 2024-02-02 VITALS — BP 116/74 | Ht <= 58 in | Wt 121.0 lb

## 2024-02-02 DIAGNOSIS — M25562 Pain in left knee: Secondary | ICD-10-CM

## 2024-02-02 DIAGNOSIS — S80212A Abrasion, left knee, initial encounter: Secondary | ICD-10-CM

## 2024-02-02 NOTE — Progress Notes (Addendum)
 PCP: [facility]  Subjective:   HPI: Patient is a 23 y.o. female here for left knee pain.  Patient was seen in urgent care on 01/24/2024 after a fall on her left knee. She had X-rays which showed no fractures. At that time patient was treated with Toradol  injection and given a knee brace and told to take ibuprofen  and tylenol .   Patient returned to ED on 01/26/2024 for continued knee pain. She had not been able to take the ibuprofen  pills as they were too large. She did not have signs of infection according to the note. She was again given a Toradol  injection and given prescription for meloxicam.   Patient said her pain and functionality has improved. However, she continues to have pain that is diffuse in her knee and radiates down to her lower leg.   For her abrasion she has kept the same dressing on since her ED visit on 7/17. She has been applying mupirocin  around the dressing (not directly on the wound).     Past Medical History:  Diagnosis Date   Anxiety    Depression    Medical history non-contributory     Current Outpatient Medications on File Prior to Visit  Medication Sig Dispense Refill   benzonatate  (TESSALON  PERLES) 100 MG capsule Take 1 capsule (100 mg total) by mouth 3 (three) times daily as needed. 30 capsule 0   docusate sodium  (COLACE) 100 MG capsule Take 1 capsule (100 mg total) by mouth 2 (two) times daily. 60 capsule 1   ferrous sulfate  300 (60 Fe) MG/5ML syrup Take 10 mLs (600 mg total) by mouth daily with breakfast. 300 mL 1   ferrous sulfate  300 (60 Fe) MG/5ML syrup Take 10 mLs (600 mg total) by mouth 2 (two) times daily with a meal. 150 mL 3   ibuprofen  (ADVIL ) 600 MG tablet Take 1 tablet (600 mg total) by mouth every 6 (six) hours as needed. 30 tablet 0   meloxicam (MOBIC) 15 MG tablet Take 1 tablet (15 mg total) by mouth daily for 10 days. 10 tablet 0   mupirocin  ointment (BACTROBAN ) 2 % Apply 1 Application topically 2 (two)  times daily. 22 g 0   ondansetron  (ZOFRAN -ODT) 8 MG disintegrating tablet Take 1 tablet (8 mg total) by mouth every 8 (eight) hours as needed for nausea or vomiting. 20 tablet 0   sertraline  (ZOLOFT ) 50 MG tablet Take 1 tablet (50 mg total) by mouth daily. 90 tablet 2   triamcinolone  (NASACORT ) 55 MCG/ACT AERO nasal inhaler Place 1 spray into the nose 2 (two) times daily. 60 each 5   Vitamin D , Ergocalciferol , (DRISDOL ) 1.25 MG (50000 UNIT) CAPS capsule Take 1 capsule (50,000 Units total) by mouth every 7 (seven) days. 4 capsule 2   No current facility-administered medications on file prior to visit.    Past Surgical History:  Procedure Laterality Date   CESAREAN SECTION N/A 12/18/2017   Procedure: CESAREAN SECTION;  Surgeon: Eveline Lynwood MATSU, MD;  Location: [facility];  Service: Obstetrics;  Laterality: N/A;   NO PAST SURGERIES      No Known Allergies  LMP 01/20/2024 (Exact Date)       No data to display              No data to display              Objective:  Physical Exam:  Gen: NAD, comfortable in exam room MSK:  L knee:  2cm circular well healing open abrasion with pink wound bed over the anterior knee.  Prior bandage was initially adhered to the wound, but was able to remove after soaking area with saline and Vaseline. No surrounding erythema.  No bleeding or drainage.  Knee with trace effusion, no increased redness or warmth Patient is tender to palpation of knee diffusely, no increased tenderness over the patella, tibial tubercle, along either joint line  Full passive and active range of motion  Lachman: negative  Varus stress: negative Valgus stress: negative McMurray: negative  Walking without a limp NEURO: sensation intact to light touch VASC: pulses 2+ and symmetric lower extremity bilaterally    Assessment & Plan:   Assessment & Plan Acute pain of left knee Continued pain most likely due to abrasion and knee contusion. Xray was negative for  fracture and patient has good range of motion on exam. Unlikely any ligamentous injury.   PLAN: - Continue meloxicam as needed  - Can use ice as needed  - Follow up 2 weeks to re-evaluate, sooner as needed Knee abrasion, left, initial encounter Patient's bandage had been dried to the wound. Sterile saline and Vaseline used to wet dressing and remove from the wound. However, once dressing was removed, wound appeared to be well healing.   PLAN: - Re-dressed wound with xeroform, telfa, and coband.  - Educated patient on daily re dressing and provided with supplies

## 2024-02-02 NOTE — Patient Instructions (Signed)
 For your left knee wound: - you should change the dressing daily - you should apply a small piece of the yellow Xeroform (which we pre-cut for you).  You can cover with a small Tegaderm or using the Telfa, then wrapping with tape or coban - It is ok to air out the wound when you are at home to help with healing.  I recommend covering to sleep - you can continue your Meloxicam once a day as needed to help with pain - you can apply ice as needed for 15-20 minutes 2-3 times a day - you should follow-up with us  in 2 weeks to check on your progress.  You can call sooner as needed

## 2024-02-06 ENCOUNTER — Encounter: Payer: Self-pay | Admitting: Family Medicine

## 2024-02-16 ENCOUNTER — Encounter: Payer: Self-pay | Admitting: Family Medicine

## 2024-02-16 ENCOUNTER — Other Ambulatory Visit: Payer: Self-pay

## 2024-02-16 ENCOUNTER — Ambulatory Visit (INDEPENDENT_AMBULATORY_CARE_PROVIDER_SITE_OTHER): Payer: MEDICAID | Admitting: Family Medicine

## 2024-02-16 VITALS — BP 90/60 | Ht <= 58 in | Wt 121.0 lb

## 2024-02-16 DIAGNOSIS — S8002XD Contusion of left knee, subsequent encounter: Secondary | ICD-10-CM | POA: Diagnosis not present

## 2024-02-16 DIAGNOSIS — S80212D Abrasion, left knee, subsequent encounter: Secondary | ICD-10-CM | POA: Diagnosis not present

## 2024-02-16 DIAGNOSIS — M25562 Pain in left knee: Secondary | ICD-10-CM

## 2024-02-16 MED ORDER — MELOXICAM 15 MG PO TABS: 15.0000 mg | ORAL_TABLET | Freq: Every day | ORAL | 0 refills | Status: AC

## 2024-02-16 NOTE — Progress Notes (Signed)
 DATE OF VISIT: 02/16/2024        Teresa Baldwin DOB: 2000-09-19 MRN: [id number]  CC:  f/u Lt knee pain  History of present Illness: Teresa Baldwin is a 23 y.o. female who presents for a follow-up visit for Lt knee pain Abrasion on the knee has healed quite well Continues to have anterior knee pain Worse when bending to get up into her bed, which she notes is quite elevated Denies any locking, clicking, catching, instability Not currently taking anything for pain  Medications:  Outpatient Encounter Medications as of 02/16/2024  Medication Sig   meloxicam  (MOBIC ) 15 MG tablet Take 1 tablet (15 mg total) by mouth daily.   benzonatate  (TESSALON  PERLES) 100 MG capsule Take 1 capsule (100 mg total) by mouth 3 (three) times daily as needed.   docusate sodium  (COLACE) 100 MG capsule Take 1 capsule (100 mg total) by mouth 2 (two) times daily.   ferrous sulfate  300 (60 Fe) MG/5ML syrup Take 10 mLs (600 mg total) by mouth daily with breakfast.   ferrous sulfate  300 (60 Fe) MG/5ML syrup Take 10 mLs (600 mg total) by mouth 2 (two) times daily with a meal.   ibuprofen  (ADVIL ) 600 MG tablet Take 1 tablet (600 mg total) by mouth every 6 (six) hours as needed.   mupirocin  ointment (BACTROBAN ) 2 % Apply 1 Application topically 2 (two) times daily.   ondansetron  (ZOFRAN -ODT) 8 MG disintegrating tablet Take 1 tablet (8 mg total) by mouth every 8 (eight) hours as needed for nausea or vomiting.   sertraline  (ZOLOFT ) 50 MG tablet Take 1 tablet (50 mg total) by mouth daily.   triamcinolone  (NASACORT ) 55 MCG/ACT AERO nasal inhaler Place 1 spray into the nose 2 (two) times daily.   Vitamin D , Ergocalciferol , (DRISDOL ) 1.25 MG (50000 UNIT) CAPS capsule Take 1 capsule (50,000 Units total) by mouth every 7 (seven) days.   No facility-administered encounter medications on file as of 02/16/2024.    Allergies: has no known allergies.  Physical Examination: Vitals: BP 90/60   Ht 4' 7 (1.397 m)   Wt 121 lb (54.9  kg)   LMP 01/20/2024 (Exact Date)   BMI 28.12 kg/m  Baldwin:  Teresa Baldwin is a 23 y.o. female appearing their stated age, alert and oriented x 3, in no apparent distress.  MSK: Knee: Left knee with well-healed anterior abrasion, no bleeding or discharge.  Full range of motion with some pain along the anterior knee at terminal flexion.  No medial or lateral joint line tenderness.  There is tender to palpation over the anterior knee over the patella and patellar tendon.  Slight tenderness over the distal quad tendon.  Negative McMurray, negative Lachman, negative posterior drawer, negative varus/valgus stress. Right knee is full range of motion without pain, weakness, instability Neurovascular intact distally  Radiology: Limited MSK ultrasound left knee Date: 02/16/2024 Indication: Left knee pain Findings: - No effusion in the suprapatellar pouch. - Distal quad tendon with slight hypoechoic change at insertion on the patella, no increased Doppler flow. - Normal-appearing patellar tendon well-visualized in short and long axis views - Soft tissue edema noted in area of the prepatellar bursa, no increased fluid in the bursa, no significant increased Doppler flow. - Normal-appearing medial and lateral meniscus - Normal-appearing MCL and LCL - Normal-appearing popliteal fossa, no signs of a Baker's cyst  Impression: - Soft tissue edema in the anterior knee and area of prepatellar bursa without increased fluid consistent with contusion - Slight distal quad tendinopathy Images  and interpretation completed by Rainell Cedar, DO    Assessment & Plan Contusion of left knee, subsequent encounter Ongoing left anterior knee pain status post fall 01/24/2024, previously had negative x-ray, MSK ultrasound today showing some soft tissue edema anteriorly, but no significant fluid collections.  History and exam consistent with knee contusion  Plan: - MSK ultrasound reviewed in detail - Rx meloxicam  15  mg 1 tab p.o. daily with food for the next 1 to 2 weeks, then as needed thereafter - Can use heat or ice as needed - Recommend trying to use a step or small stepstool for getting in and out of bed to help unload the knee - She will follow-up in 2 weeks for reevaluation, could consider advanced imaging or physical therapy if symptoms persist Knee abrasion, left, subsequent encounter Left knee abrasion has healed quite nicely  Plan: - No further intervention needed, should continue to heal at this time   Patient expressed understanding & agreement with above.  Encounter Diagnoses  Name Primary?   Contusion of left knee, subsequent encounter Yes   Knee abrasion, left, subsequent encounter     Orders Placed This Encounter  Procedures   US  LIMITED JOINT SPACE STRUCTURES LOW LEFT

## 2024-02-16 NOTE — Patient Instructions (Signed)
 You have ongoing pain due to some inflammation and swelling after your fall - I sent a prescription for Meloxicam  (Mobic ) to there pharmacy for you.  This will help with pain and inflammation.  You can take 1 tab a day with food for 2 weeks - You can purchase Salonpas Lidocaine  patches at the pharmacy to apply to the knee to help with your pain - you should follow-up with me in 2-3 weeks

## 2024-03-08 ENCOUNTER — Encounter: Payer: Self-pay | Admitting: Family Medicine

## 2024-03-08 ENCOUNTER — Ambulatory Visit: Payer: MEDICAID | Admitting: Family Medicine

## 2024-03-08 VITALS — BP 100/60 | Ht <= 58 in | Wt 121.0 lb

## 2024-03-08 DIAGNOSIS — S80212D Abrasion, left knee, subsequent encounter: Secondary | ICD-10-CM

## 2024-03-08 DIAGNOSIS — S8002XD Contusion of left knee, subsequent encounter: Secondary | ICD-10-CM

## 2024-03-08 DIAGNOSIS — M25562 Pain in left knee: Secondary | ICD-10-CM

## 2024-03-08 NOTE — Progress Notes (Signed)
 DATE OF VISIT: 03/08/2024        Teresa Baldwin General DOB: 2001-05-09 MRN: [id number]  CC:  f/u Lt knee pain  History of present Illness: Teresa Baldwin is a 23 y.o. female who presents for a follow-up visit for Lt knee pain S/p fall 01/24/24 Seen by me 02/02/24 and 02/16/24 At last was given prescription for meloxicam  and recommended to use a knee sleeve Since that visit she reports continues to have ongoing pain, only slightly improved Having a lot of pain when going up and down stairs, also pain when trying to get in and out of bed Is having feeling of instability Some associated swelling Denies any clicking or locking No new injury or trauma  Medications:  Outpatient Encounter Medications as of 03/08/2024  Medication Sig   benzonatate  (TESSALON  PERLES) 100 MG capsule Take 1 capsule (100 mg total) by mouth 3 (three) times daily as needed.   docusate sodium  (COLACE) 100 MG capsule Take 1 capsule (100 mg total) by mouth 2 (two) times daily.   ferrous sulfate  300 (60 Fe) MG/5ML syrup Take 10 mLs (600 mg total) by mouth daily with breakfast.   ferrous sulfate  300 (60 Fe) MG/5ML syrup Take 10 mLs (600 mg total) by mouth 2 (two) times daily with a meal.   ibuprofen  (ADVIL ) 600 MG tablet Take 1 tablet (600 mg total) by mouth every 6 (six) hours as needed.   meloxicam  (MOBIC ) 15 MG tablet Take 1 tablet (15 mg total) by mouth daily.   mupirocin  ointment (BACTROBAN ) 2 % Apply 1 Application topically 2 (two) times daily.   ondansetron  (ZOFRAN -ODT) 8 MG disintegrating tablet Take 1 tablet (8 mg total) by mouth every 8 (eight) hours as needed for nausea or vomiting.   sertraline  (ZOLOFT ) 50 MG tablet Take 1 tablet (50 mg total) by mouth daily.   triamcinolone  (NASACORT ) 55 MCG/ACT AERO nasal inhaler Place 1 spray into the nose 2 (two) times daily.   Vitamin D , Ergocalciferol , (DRISDOL ) 1.25 MG (50000 UNIT) CAPS capsule Take 1 capsule (50,000 Units total) by mouth every 7 (seven) days.   No  facility-administered encounter medications on file as of 03/08/2024.    Allergies: has no known allergies.  Physical Examination: Vitals: BP 100/60   Ht 4' 10 (1.473 m)   Wt 121 lb (54.9 kg)   BMI 25.29 kg/m  GENERAL:  Teresa Baldwin is a 23 y.o. female appearing their stated age, alert and oriented x 3, in no apparent distress.  SKIN: Well-healed abrasion of the left anterior knee, still with some slight dry skin, no bleeding or discharge. MSK: Knee: Left knee with trace effusion.  Full range of motion with pain at terminal extension and flexion.  Exquisitely tender palpation along over the medial and lateral joint line.  Negative McMurray.  No tenderness over the patellar tendon or the quad tendon.  Negative Lachman, negative varus/valgus stress. Right knee with full range of motion without pain. Walking with a slight limp Neurovascular intact distally   Radiology: Limited MSK ultrasound left knee Date: 02/16/2024 Indication: Left knee pain Findings: - No effusion in the suprapatellar pouch. - Distal quad tendon with slight hypoechoic change at insertion on the patella, no increased Doppler flow. - Normal-appearing patellar tendon well-visualized in short and long axis views - Soft tissue edema noted in area of the prepatellar bursa, no increased fluid in the bursa, no significant increased Doppler flow. - Normal-appearing medial and lateral meniscus - Normal-appearing MCL and LCL - Normal-appearing popliteal fossa, no  signs of a Baker's cyst   Impression: - Soft tissue edema in the anterior knee and area of prepatellar bursa without increased fluid consistent with contusion - Slight distal quad tendinopathy Images and interpretation completed by Rainell Cedar, DO   LT KNEE XR 01/24/24 from urgent care showing: - no acute bony abnormality - no other abnormalities  Assessment & Plan Contusion of left knee, subsequent encounter Ongoing left anterior knee pain status post  fall 01/24/2024.  Previously had a negative x-ray and relatively unremarkable MSK ultrasound.  Limited improvement with oral NSAIDs, bracing.  On exam has medial and lateral joint line tenderness, also reports feelings of instability.  Concern for possible internal derangement versus contusion versus occult fracture with  Plan: - Will proceed with MRI left knee to rule out meniscus tear, occult fracture, or other soft tissue injury.  She would be interested in surgical intervention if indicated - Will continue her meloxicam  15 mg p.o. daily as needed with food - Continue heat or ice as needed - Continue use knee sleeve - Will follow-up with me 1 week after MRI to review results and discuss further treatment Acute pain of left knee As noted above Knee abrasion, left, subsequent encounter Left knee abrasion now completely healed  Plan: - Follow-up as needed   Patient expressed understanding & agreement with above.  Encounter Diagnoses  Name Primary?   Contusion of left knee, subsequent encounter Yes   Acute pain of left knee    Knee abrasion, left, subsequent encounter     Orders Placed This Encounter  Procedures   MR Knee Left  Wo Contrast

## 2024-03-13 NOTE — Addendum Note (Signed)
 Addended by: MARTHA BOUCHARD E on: 03/13/2024 10:56 AM   Modules accepted: Orders

## 2024-03-20 ENCOUNTER — Encounter: Payer: Self-pay | Admitting: Family Medicine

## 2024-03-22 ENCOUNTER — Encounter: Payer: Self-pay | Admitting: Family Medicine

## 2024-03-22 ENCOUNTER — Ambulatory Visit: Payer: MEDICAID | Admitting: Family Medicine

## 2024-03-22 VITALS — BP 112/80 | HR 94 | Temp 98.5°F | Ht <= 58 in | Wt 129.0 lb

## 2024-03-22 DIAGNOSIS — Z0000 Encounter for general adult medical examination without abnormal findings: Secondary | ICD-10-CM

## 2024-03-22 DIAGNOSIS — Z23 Encounter for immunization: Secondary | ICD-10-CM

## 2024-03-22 DIAGNOSIS — F331 Major depressive disorder, recurrent, moderate: Secondary | ICD-10-CM | POA: Diagnosis not present

## 2024-03-22 DIAGNOSIS — Z1159 Encounter for screening for other viral diseases: Secondary | ICD-10-CM

## 2024-03-22 DIAGNOSIS — F70 Mild intellectual disabilities: Secondary | ICD-10-CM

## 2024-03-22 DIAGNOSIS — E559 Vitamin D deficiency, unspecified: Secondary | ICD-10-CM | POA: Diagnosis not present

## 2024-03-22 DIAGNOSIS — Z136 Encounter for screening for cardiovascular disorders: Secondary | ICD-10-CM

## 2024-03-22 NOTE — Progress Notes (Signed)
 Teresa Baldwin, CMA,acting as a Neurosurgeon for [employer], NP.,have documented all relevant documentation on the behalf of Teresa Creighton, NP,as directed by  Teresa Creighton, NP while in the presence of Teresa Creighton, NP.  Subjective:    Patient ID: Teresa Baldwin , female    DOB: Nov 20, 2000 , 23 y.o.   MRN: [id number]  Chief Complaint  Patient presents with   Annual Exam    Patient presents today for a physical. Patient reports she doesn't have any questions or concerns at this time.     HPI Discussed the use of AI scribe software for clinical note transcription with the patient, who gave verbal consent to proceed.  History of Present Illness      Teresa Baldwin is a 23 year old female who presents for her annual physical exam with Pap.  She has not been attending counseling recently as she feels better and has noticed positive changes in herself. She is not currently taking any medications, including sertraline , and has been managing well without them.  She had been taking insulin but decided to take a break after noticing changes in her condition.  Her last menstrual period ended yesterday, and she is not currently using any form of contraception. She is not sexually active at this time.  She does not take a multivitamin but has been taking vitamin D  supplements once weekly as prescribed previously due to low levels.  She recalls having a Pap smear last year while trying to renew her birth control at a shelter. The results were normal, but she is unable to provide the exact location or records of the procedure.  She has a young child and was previously staying at a shelter.  She has Nexplanon  in place in her LUA for contraceptive management , that was placed in 2024.      Past Medical History:  Diagnosis Date   Anxiety    Depression    Medical history non-contributory      Family History  Problem Relation Age of Onset   Uterine cancer Mother    Hypertension Father       Current Outpatient Medications:    Vitamin D , Ergocalciferol , (DRISDOL ) 1.25 MG (50000 UNIT) CAPS capsule, Take 1 capsule (50,000 Units total) by mouth every 7 (seven) days., Disp: 4 capsule, Rfl: 2   benzonatate  (TESSALON  PERLES) 100 MG capsule, Take 1 capsule (100 mg total) by mouth 3 (three) times daily as needed. (Patient not taking: Reported on 03/22/2024), Disp: 30 capsule, Rfl: 0   docusate sodium  (COLACE) 100 MG capsule, Take 1 capsule (100 mg total) by mouth 2 (two) times daily. (Patient not taking: Reported on 03/22/2024), Disp: 60 capsule, Rfl: 1   ferrous sulfate  300 (60 Fe) MG/5ML syrup, Take 10 mLs (600 mg total) by mouth daily with breakfast. (Patient not taking: Reported on 03/22/2024), Disp: 300 mL, Rfl: 1   ferrous sulfate  300 (60 Fe) MG/5ML syrup, Take 10 mLs (600 mg total) by mouth 2 (two) times daily with a meal. (Patient not taking: Reported on 03/22/2024), Disp: 150 mL, Rfl: 3   ibuprofen  (ADVIL ) 600 MG tablet, Take 1 tablet (600 mg total) by mouth every 6 (six) hours as needed. (Patient not taking: Reported on 03/22/2024), Disp: 30 tablet, Rfl: 0   meloxicam  (MOBIC ) 15 MG tablet, Take 1 tablet (15 mg total) by mouth daily. (Patient not taking: Reported on 03/22/2024), Disp: 30 tablet, Rfl: 0   mupirocin  ointment (BACTROBAN ) 2 %, Apply 1 Application topically 2 (two)  times daily. (Patient not taking: Reported on 03/22/2024), Disp: 22 g, Rfl: 0   ondansetron  (ZOFRAN -ODT) 8 MG disintegrating tablet, Take 1 tablet (8 mg total) by mouth every 8 (eight) hours as needed for nausea or vomiting. (Patient not taking: Reported on 03/22/2024), Disp: 20 tablet, Rfl: 0   sertraline  (ZOLOFT ) 50 MG tablet, Take 1 tablet (50 mg total) by mouth daily. (Patient not taking: Reported on 03/22/2024), Disp: 90 tablet, Rfl: 2   triamcinolone  (NASACORT ) 55 MCG/ACT AERO nasal inhaler, Place 1 spray into the nose 2 (two) times daily. (Patient not taking: Reported on 03/22/2024), Disp: 60 each, Rfl: 5   No  Known Allergies     Social History   Tobacco Use  Smoking Status Never   Passive exposure: Never  Smokeless Tobacco Never  . Social History   Substance and Sexual Activity  Alcohol Use Never    Review of Systems  Constitutional: Negative.   HENT: Negative.    Eyes: Negative.   Respiratory: Negative.    Cardiovascular: Negative.   Gastrointestinal: Negative.   Endocrine: Negative.   Genitourinary: Negative.   Musculoskeletal: Negative.   Skin: Negative.   Allergic/Immunologic: Negative.   Neurological: Negative.   Hematological: Negative.   Psychiatric/Behavioral: Negative.       Today's Vitals   03/22/24 0932  BP: 112/80  Pulse: 94  Temp: 98.5 F (36.9 C)  TempSrc: Oral  Weight: 129 lb (58.5 kg)  Height: 4' 10 (1.473 m)  PainSc: 0-No pain   Body mass index is 26.96 kg/m.  Wt Readings from Last 3 Encounters:  03/22/24 129 lb (58.5 kg)  03/08/24 121 lb (54.9 kg)  02/16/24 121 lb (54.9 kg)     Objective:  Physical Exam Constitutional:      Appearance: Normal appearance.  HENT:     Head: Normocephalic.  Cardiovascular:     Rate and Rhythm: Normal rate and regular rhythm.     Pulses: Normal pulses.     Heart sounds: Normal heart sounds.  Pulmonary:     Effort: Pulmonary effort is normal.     Breath sounds: Normal breath sounds.  Abdominal:     Baldwin: Bowel sounds are normal.  Musculoskeletal:        Baldwin: Normal range of motion.  Skin:    Baldwin: Skin is warm and dry.  Neurological:     Baldwin: No focal deficit present.     Mental Status: She is alert and oriented to person, place, and time. Mental status is at baseline.  Psychiatric:        Mood and Affect: Mood normal.         Assessment And Plan:     Encounter for Baldwin adult medical examination w/o abnormal findings  Moderate episode of recurrent major depressive disorder (HCC) -     CBC -     CMP14+EGFR  Vitamin D  deficiency -     VITAMIN D  25 Hydroxy (Vit-D  Deficiency, Fractures)  Intellectual developmental disorder, mild  Encounter for hepatitis C screening test for low risk patient -     Hepatitis C antibody  Need for influenza vaccination -     Flu vaccine trivalent PF, 6mos and older(Flulaval,Afluria,Fluarix,Fluzone)  Screening for cardiovascular condition -     Lipid panel     Return for 1 year physical. Patient was given opportunity to ask questions. Patient verbalized understanding of the plan and was able to repeat key elements of the plan. All questions were answered to their satisfaction.  I, Teresa Creighton, NP, have reviewed all documentation for this visit. The documentation on 03/22/2024 for the exam, diagnosis, procedures, and orders are all accurate and complete.

## 2024-03-22 NOTE — Patient Instructions (Signed)

## 2024-03-23 LAB — LIPID PANEL
Chol/HDL Ratio: 3.1 ratio (ref 0.0–4.4)
Cholesterol, Total: 187 mg/dL (ref 100–199)
HDL: 60 mg/dL (ref >39)
LDL Chol Calc (NIH): 118 mg/dL — ABNORMAL HIGH (ref 0–99)
Triglycerides: 49 mg/dL (ref 0–149)
VLDL Cholesterol Cal: 9 mg/dL (ref 5–40)

## 2024-03-23 LAB — CBC
Hematocrit: 37.7 % (ref 34.0–46.6)
Hemoglobin: 12.4 g/dL (ref 11.1–15.9)
MCH: 28.1 pg (ref 26.6–33.0)
MCHC: 32.9 g/dL (ref 31.5–35.7)
MCV: 86 fL (ref 79–97)
Platelets: 287 x10E3/uL (ref 150–450)
RBC: 4.41 x10E6/uL (ref 3.77–5.28)
RDW: 13.6 % (ref 11.7–15.4)
WBC: 6.6 x10E3/uL (ref 3.4–10.8)

## 2024-03-23 LAB — CMP14+EGFR
ALT: 11 IU/L (ref 0–32)
AST: 19 IU/L (ref 0–40)
Albumin: 4.4 g/dL (ref 4.0–5.0)
Alkaline Phosphatase: 59 IU/L (ref 44–121)
BUN/Creatinine Ratio: 15 (ref 9–23)
BUN: 11 mg/dL (ref 6–20)
Bilirubin Total: 0.4 mg/dL (ref 0.0–1.2)
CO2: 21 mmol/L (ref 20–29)
Calcium: 10 mg/dL (ref 8.7–10.2)
Chloride: 100 mmol/L (ref 96–106)
Creatinine, Ser: 0.73 mg/dL (ref 0.57–1.00)
Globulin, Total: 3.6 g/dL (ref 1.5–4.5)
Glucose: 103 mg/dL — ABNORMAL HIGH (ref 70–99)
Potassium: 4 mmol/L (ref 3.5–5.2)
Sodium: 135 mmol/L (ref 134–144)
Total Protein: 8 g/dL (ref 6.0–8.5)
eGFR: 118 mL/min/1.73 (ref >59)

## 2024-03-23 LAB — HEPATITIS C ANTIBODY: Hep C Virus Ab: NONREACTIVE

## 2024-03-23 LAB — VITAMIN D 25 HYDROXY (VIT D DEFICIENCY, FRACTURES): Vit D, 25-Hydroxy: 8.9 ng/mL — ABNORMAL LOW (ref 30.0–100.0)

## 2024-03-24 ENCOUNTER — Other Ambulatory Visit: Payer: MEDICAID

## 2024-03-27 ENCOUNTER — Ambulatory Visit: Payer: Self-pay | Admitting: Family Medicine

## 2024-03-27 NOTE — Progress Notes (Signed)
 Last seen by me 03/08/2024 for left knee pain following traumatic injury 01/24/2024.  Has ongoing pain.  Limited improvement with conservative therapy.  MRI without contrast requested at that visit to rule out internal derangement and possible occult fracture.  Health plan requested peer to peer because service was denied.  Peer to peer phone call completed by me today 03/27/2024 at 12:35 PM EST.  Spoke to medical director Josette Marina on the phone.  After reviewing case she stated she needed to send her recommendations to qualified specialist for final review.  Her recommendation was that MRI should be approved to rule out occult fracture and patient unable to tolerate PT and having worsening symptoms affecting daily activities.  Medical director indicated final decision should be rendered by the end of today or early tomorrow.  We will receive a fax and the portal online should also be updated.

## 2024-04-10 ENCOUNTER — Ambulatory Visit
Admission: RE | Admit: 2024-04-10 | Discharge: 2024-04-10 | Disposition: A | Payer: MEDICAID | Source: Ambulatory Visit | Attending: Family Medicine | Admitting: Family Medicine

## 2024-04-10 DIAGNOSIS — M25562 Pain in left knee: Secondary | ICD-10-CM

## 2024-04-10 DIAGNOSIS — S80212D Abrasion, left knee, subsequent encounter: Secondary | ICD-10-CM

## 2024-04-10 DIAGNOSIS — S8002XD Contusion of left knee, subsequent encounter: Secondary | ICD-10-CM

## 2024-04-16 ENCOUNTER — Ambulatory Visit: Payer: Self-pay | Admitting: Family Medicine

## 2024-04-16 NOTE — Progress Notes (Signed)
 MRI reviewed, MyChart message sent to patient to schedule follow-up appoint with me to review and reassess

## 2024-05-01 ENCOUNTER — Ambulatory Visit (INDEPENDENT_AMBULATORY_CARE_PROVIDER_SITE_OTHER): Payer: MEDICAID | Admitting: Family Medicine

## 2024-05-01 ENCOUNTER — Encounter: Payer: Self-pay | Admitting: Family Medicine

## 2024-05-01 VITALS — BP 98/65 | Ht <= 58 in | Wt 121.0 lb

## 2024-05-01 DIAGNOSIS — S83005S Unspecified dislocation of left patella, sequela: Secondary | ICD-10-CM

## 2024-05-01 DIAGNOSIS — S8002XD Contusion of left knee, subsequent encounter: Secondary | ICD-10-CM

## 2024-05-01 NOTE — Progress Notes (Deleted)
 DATE OF VISIT: 05/01/2024        Teresa Baldwin DOB: 04/28/2001 MRN: [id number]  Discussed the use of AI scribe software for clinical note transcription with the patient, who gave verbal consent to proceed.  History of Present Illness     Medications:  Outpatient Encounter Medications as of 05/01/2024  Medication Sig   docusate sodium  (COLACE) 100 MG capsule Take 1 capsule (100 mg total) by mouth 2 (two) times daily. (Patient not taking: Reported on 03/22/2024)   ferrous sulfate  300 (60 Fe) MG/5ML syrup Take 10 mLs (600 mg total) by mouth daily with breakfast. (Patient not taking: Reported on 03/22/2024)   ferrous sulfate  300 (60 Fe) MG/5ML syrup Take 10 mLs (600 mg total) by mouth 2 (two) times daily with a meal. (Patient not taking: Reported on 03/22/2024)   ibuprofen  (ADVIL ) 600 MG tablet Take 1 tablet (600 mg total) by mouth every 6 (six) hours as needed. (Patient not taking: Reported on 03/22/2024)   meloxicam  (MOBIC ) 15 MG tablet Take 1 tablet (15 mg total) by mouth daily. (Patient not taking: Reported on 03/22/2024)   mupirocin  ointment (BACTROBAN ) 2 % Apply 1 Application topically 2 (two) times daily. (Patient not taking: Reported on 03/22/2024)   sertraline  (ZOLOFT ) 50 MG tablet Take 1 tablet (50 mg total) by mouth daily. (Patient not taking: Reported on 03/22/2024)   triamcinolone  (NASACORT ) 55 MCG/ACT AERO nasal inhaler Place 1 spray into the nose 2 (two) times daily. (Patient not taking: Reported on 03/22/2024)   Vitamin D , Ergocalciferol , (DRISDOL ) 1.25 MG (50000 UNIT) CAPS capsule Take 1 capsule (50,000 Units total) by mouth every 7 (seven) days.   No facility-administered encounter medications on file as of 05/01/2024.    Allergies: has no known allergies.  Physical Examination: Vitals: There were no vitals taken for this visit. GENERAL:  Teresa Baldwin is a 23 y.o. female appearing their stated age, alert and oriented x 3, in no apparent distress.  SKIN: no rashes or  lesions, skin clean, dry, intact MSK: *** NEURO: sensation intact to light touch, DTR *** VASC: pulses 2+ and symmetric *** bilaterally, no edema  Radiology: XRAY: *** MRI: ***  Assessment and Plan Assessment & Plan      Patient expressed understanding & agreement with above.  No diagnosis found.  No orders of the defined types were placed in this encounter.      Contains text generated by Abridge.

## 2024-05-01 NOTE — Progress Notes (Signed)
 DATE OF VISIT: 05/01/2024        Kadejah Sandiford DOB: 09-15-2000 MRN: [id number]  Discussed the use of AI scribe software for clinical note transcription with the patient, who gave verbal consent to proceed.  History of Present Illness Bambie Pizzolato is a 23 year old female who presents for follow-up of left knee pain.  She is status post fall 01/24/2024 Last seen by me 03/08/2024, completed MRI 04/10/2024 as noted below  Left knee pain - Persistent pain since previous fall, with improvement since last visit - Pain localized deep within the knee, particularly anteriorly, with additional discomfort at the medial facet and mild discomfort posteriorly - Pain exacerbated by climbing stairs - No pain in other areas of the knee  Functional impairment - Difficulty with ambulation due to knee pain - Application for SSI benefits due to inability to walk properly  Imaging and diagnostic evaluation - MRI of the left knee performed after previous fall; results discussed during visit as noted below  Therapeutic interventions - No formal physical therapy since injury - Currently using Tylenol  for pain relief - Not using ibuprofen  or Motrin  - Meloxicam  available from previous prescription, but not taking    Medications:  Outpatient Encounter Medications as of 05/01/2024  Medication Sig   docusate sodium  (COLACE) 100 MG capsule Take 1 capsule (100 mg total) by mouth 2 (two) times daily. (Patient not taking: Reported on 03/22/2024)   ferrous sulfate  300 (60 Fe) MG/5ML syrup Take 10 mLs (600 mg total) by mouth daily with breakfast. (Patient not taking: Reported on 03/22/2024)   ferrous sulfate  300 (60 Fe) MG/5ML syrup Take 10 mLs (600 mg total) by mouth 2 (two) times daily with a meal. (Patient not taking: Reported on 03/22/2024)   ibuprofen  (ADVIL ) 600 MG tablet Take 1 tablet (600 mg total) by mouth every 6 (six) hours as needed. (Patient not taking: Reported on 03/22/2024)   meloxicam  (MOBIC )  15 MG tablet Take 1 tablet (15 mg total) by mouth daily. (Patient not taking: Reported on 03/22/2024)   mupirocin  ointment (BACTROBAN ) 2 % Apply 1 Application topically 2 (two) times daily. (Patient not taking: Reported on 03/22/2024)   sertraline  (ZOLOFT ) 50 MG tablet Take 1 tablet (50 mg total) by mouth daily. (Patient not taking: Reported on 03/22/2024)   triamcinolone  (NASACORT ) 55 MCG/ACT AERO nasal inhaler Place 1 spray into the nose 2 (two) times daily. (Patient not taking: Reported on 03/22/2024)   Vitamin D , Ergocalciferol , (DRISDOL ) 1.25 MG (50000 UNIT) CAPS capsule Take 1 capsule (50,000 Units total) by mouth every 7 (seven) days.   No facility-administered encounter medications on file as of 05/01/2024.    Allergies: has no known allergies.  Physical Examination: Vitals: BP 98/65   Ht 4' 10 (1.473 m)   Wt 121 lb (54.9 kg)   BMI 25.29 kg/m  GENERAL:  Alicha Raspberry is a 23 y.o. female appearing their stated age, alert and oriented x 3, in no apparent distress.  SKIN: no rashes or lesions, skin clean, dry, intact MSK: KNEE: LT KNEE without swelling or effusion.  FROM with pain at terminal extension.  Mild tenderness palpation along the medial patellar facets.  Negative patellar apprehension.  Minimal medial joint line tenderness.  No lateral joint line tenderness.  No ligamentous laxity.  Walking without a limp Neurovascular intact distally  Radiology: MRI LT KNEE W/O CONTRAST 04/10/24 showing: IMPRESSION: 1. Intact ligamentous structures and no acute bony findings. 2. No meniscal tears. 3. Mild cartilaginous irregularity along the medial  most aspect of the medial facet of the patella suggesting prior injury. 4. Moderate thickening and inflammation/edema along the medial retinacular attachment site on the patella. There is also mild thickening and inflammation/edema in the upper medial aspect of Hoffa's fat. Findings could suggest a prior patellar dislocation injury,  retinaculum sprain or partial tear. 5. MCL and pes anserine bursitis. 6. No joint effusion or Baker's cyst.  Assessment & Plan Left knee pain status post fall 01/24/2024 with MRI showing irregularity of chondral surface of the patella and concern for possible prior patellar dislocation.  Also mild pes bursitis and fat pad inflammation.  No instability on exam today - Non-surgical, conservative management indicated. - Recommend physical therapy to strengthen thigh muscles and alleviate knee pain. Referral to [facility] on [address] given today - Fitted with a patellar stabilization knee brace today for support. - Recommended restarting Meloxicam  15mg  daily prn for inflammation management. - Follow up in four to six weeks to assess progress.  Patient expressed understanding & agreement with above.  Encounter Diagnoses  Name Primary?   Contusion of left knee, subsequent encounter Yes   Patellar dislocation, left, sequela     Orders Placed This Encounter  Procedures   Ambulatory referral to Physical Therapy     VISIT SUMMARY: You came in today for a follow-up visit regarding your left knee pain. We discussed the results of your MRI and reviewed your current symptoms and treatment plan.  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 We will manage this conservatively without surgery. You will start physical therapy to strengthen your thigh muscles and reduce knee pain. You have been fitted with a knee brace for additional support. You should take meloxicam  to help with inflammation. Physical therapy sessions are scheduled twice a week for four to six weeks at [facility] on [address].  INSTRUCTIONS: Please follow up in four to six weeks to assess your progress. Continue with the physical therapy sessions as scheduled and use the knee brace and meloxicam  as recommended. Contains text generated by Abridge.

## 2024-05-17 ENCOUNTER — Ambulatory Visit: Payer: MEDICAID | Admitting: Physical Therapy

## 2024-05-17 NOTE — Therapy (Deleted)
 OUTPATIENT PHYSICAL THERAPY LOWER EXTREMITY EVALUATION  Patient Name: Teresa Baldwin MRN: [id number] DOB:2001/03/01, 23 y.o., female Today's Date: 05/17/2024    Past Medical History:  Diagnosis Date   Anxiety    Depression    Medical history non-contributory    Past Surgical History:  Procedure Laterality Date   CESAREAN SECTION N/A 12/18/2017   Procedure: CESAREAN SECTION;  Surgeon: Eveline Lynwood MATSU, MD;  Location: [facility] BIRTHING SUIT[facility];  Service: Obstetrics;  Laterality: N/A;   NO PAST SURGERIES     Patient Active Problem List   Diagnosis Date Noted   Encounter for hepatitis C screening test for low risk patient 03/22/2024   Need for influenza vaccination 03/22/2024   Screening for cardiovascular condition 03/22/2024   Encounter for general adult medical examination w/o abnormal findings 03/22/2024   Moderate episode of recurrent major depressive disorder (HCC) 09/16/2023   Adjustment disorder with mixed emotional features 09/05/2023   Intellectual developmental disorder, mild 08/11/2023   Vitamin D  deficiency 08/05/2023   Nasal sinus congestion 08/05/2023   Anxiety and depression 08/05/2023   Dry cough 08/05/2023   Establishing care with new doctor, encounter for 08/05/2023   Status post primary low transverse cesarean section 12/18/2017   Uterine fibroid 12/18/2017   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  REFERRING DIAG: Contusion of left knee, subsequent encounter [S80.02XD], Patellar dislocation, left, sequela [S83.005S]   Rationale for Evaluation and Treatment:  Rehabilitation  SUBJECTIVE:  PERTINENT PAST HISTORY:  MDD,         PRECAUTIONS: {Therapy precautions:24002}  WEIGHT BEARING RESTRICTIONS {Yes ***/No:24003}  FALLS:  Has patient fallen in last 6 months? {yes/no:20286}, Number of falls: ***  MOI/History of condition:  Onset date: ***  SUBJECTIVE  STATEMENT  Pt is a 23 y.o. female who presents to clinic with chief complaint of ***.  ***   Red flags:  {has/denies:26543} {kerredflag:26542}  Pain:  Are you having pain? {yes/no:20286} Pain location: *** NPRS scale:  Best: {NUMBERS; 0-10:5044}/10, Worst: {NUMBERS; 0-10:5044}/10 Aggravating factors: *** Relieving factors: *** Pain description: {PAIN DESCRIPTION:21022940}  Occupation: ***  Assistive Device: ***  Hand Dominance: ***  Patient Goals/Specific Activities: ***   OBJECTIVE:   DIAGNOSTIC FINDINGS:  ***  GENERAL OBSERVATION/GAIT: ***  SENSATION: Light touch: {intact/deficits:24005}  PALPATION: ***  MUSCLE LENGTH: Hamstrings: Right {kerminsig:27227} restriction; Left {kerminsig:27227} restriction Hip flexors: Right {kerminsig:27227} restriction; Left {kerminsig:27227} restriction Rec Fem: Right {kerminsig:27227} restriction; Left {kerminsig:27227} restriction  LE MMT:  MMT Right (Eval) Left (Eval)  Hip flexion (L2, L3) *** ***  Knee extension (L3) *** ***  Knee flexion *** ***  Hip abduction *** ***  Hip extension *** ***  Hip external rotation *** ***  Hip internal rotation *** ***  Hip adduction    Ankle dorsiflexion (L4)    Ankle plantarflexion (S1)    Ankle inversion    Ankle eversion    Great Toe ext (L5)    Grossly     (Blank rows = not tested, score listed is out of 5 possible points OR may be listed in lbs of force.  N = WNL, D = diminished, C = clear for gross weakness with myotome testing, * = concordant pain with testing)  LE ROM:  ROM Right (Eval) Left (Eval)  Hip flexion    Hip extension    Hip abduction    Hip adduction    Hip internal rotation  Hip external rotation    Knee extension *** ***  Knee flexion *** ***  Ankle dorsiflexion *** ***  Ankle plantarflexion    Ankle inversion    Ankle eversion     (Blank rows = not tested, N = WNL, * = concordant pain with testing)  Functional Tests  Eval                                                               SPECIAL TESTS:  ***   PATIENT SURVEYS:  LEFS: ***  HOME EXERCISE PROGRAM: ***  Treatment priorities   Eval                                                 TODAY'S TREATMENT:  Therapeutic Exercise: Creating, reviewing, and completing HEP   PATIENT EDUCATION ([place]/HM):  POC, diagnosis, prognosis, HEP, and outcome measures.  Pt educated via explanation, demonstration, and handout (HEP).  Pt confirms understanding verbally.   ASSESSMENT:  CLINICAL IMPRESSION: Teresa Baldwin is a 23 y.o. female who presents to clinic with signs and sxs consistent with ***.   ***.   Teresa Baldwin Teresa Baldwin benefit from skilled PT to address relevant deficits and improve ***.   OBJECTIVE IMPAIRMENTS: Pain, ***  ACTIVITY LIMITATIONS: ***  PERSONAL FACTORS: See medical history and pertinent history   REHAB POTENTIAL: Good  CLINICAL DECISION MAKING: Evolving/moderate complexity  EVALUATION COMPLEXITY: Moderate   GOALS:   SHORT TERM GOALS: Target date: ***  Teresa Baldwin Teresa Baldwin be >75% HEP compliant to improve carryover between sessions and facilitate independent management of condition  Evaluation: ongoing Goal status: INITIAL   LONG TERM GOALS: Target date: ***  Teresa Baldwin Teresa Baldwin self report >/= 50% decrease in pain from evaluation to improve function in daily tasks  Evaluation/Baseline: ***/10 max pain Goal status: INITIAL   2.  Teresa Baldwin Teresa Baldwin show a >/= *** pt improvement in LEFS score (MCID is ~11% or 9 pts) as a proxy for functional improvement   Evaluation/Baseline: *** pts Goal status: INITIAL   3.  Teresa Baldwin Teresa Baldwin be able to ***, not limited by pain  Evaluation/Baseline: limited Goal status: INITIAL   4.  ***   5.  ***   6.  ***   PLAN: PT FREQUENCY: 1-2x/week  PT DURATION: 8 weeks  PLANNED INTERVENTIONS:  97164- PT Re-evaluation, 97110-Therapeutic exercises, 97530- Therapeutic activity, [id number]-  Neuromuscular re-education, 97535- Self Care, 02859- Manual therapy, [id number]- Gait training, [id number]- Aquatic Therapy, [phone]- Electrical stimulation (manual), [id number]- Vasopneumatic device, [id number]- Traction (mechanical), [id number]- Ionotophoresis 4mg /ml Dexamethasone , Taping, Dry Needling, Joint manipulation, and Spinal manipulation.   Teresa Baldwin Threat E Demarkis Gheen PT 05/17/2024, 8:52 AM

## 2024-05-31 ENCOUNTER — Ambulatory Visit: Payer: MEDICAID | Admitting: Family Medicine

## 2024-05-31 ENCOUNTER — Encounter: Payer: Self-pay | Admitting: Family Medicine

## 2024-05-31 VITALS — BP 100/69 | Ht <= 58 in | Wt 121.0 lb

## 2024-05-31 DIAGNOSIS — S8002XD Contusion of left knee, subsequent encounter: Secondary | ICD-10-CM | POA: Diagnosis not present

## 2024-05-31 DIAGNOSIS — S83005S Unspecified dislocation of left patella, sequela: Secondary | ICD-10-CM

## 2024-05-31 NOTE — Progress Notes (Signed)
 DATE OF VISIT: 05/31/2024        Teresa Baldwin DOB: 09-24-2000 MRN: [id number]  Discussed the use of AI scribe software for clinical note transcription with the patient, who gave verbal consent to proceed.  History of Present Illness Teresa Baldwin is a 23 year old female who presents for follow-up of LT knee pain. Last seen 05/01/24  LT Knee pain - Persistent knee pain, particularly when climbing stairs and sitting down - Requires holding onto a chair to sit down comfortably - Discomfort when stretching her leg out to sit - No new injuries since the last visit  Knee brace use - New patellar stabilization knee brace fitted at last visit one month ago - Brace provides some support but pain persists - Occasionally removes the knee brace  Physical therapy - Has not started physical therapy due to missed appointment on November 16th related to lack of insurance-provided transportation - First physical therapy session scheduled for tomorrow  Imaging findings - Previous MRI demonstrated inflammation    Medications:  Outpatient Encounter Medications as of 05/31/2024  Medication Sig   docusate sodium  (COLACE) 100 MG capsule Take 1 capsule (100 mg total) by mouth 2 (two) times daily. (Patient not taking: Reported on 03/22/2024)   ferrous sulfate  300 (60 Fe) MG/5ML syrup Take 10 mLs (600 mg total) by mouth daily with breakfast. (Patient not taking: Reported on 03/22/2024)   ferrous sulfate  300 (60 Fe) MG/5ML syrup Take 10 mLs (600 mg total) by mouth 2 (two) times daily with a meal. (Patient not taking: Reported on 03/22/2024)   ibuprofen  (ADVIL ) 600 MG tablet Take 1 tablet (600 mg total) by mouth every 6 (six) hours as needed. (Patient not taking: Reported on 03/22/2024)   meloxicam  (MOBIC ) 15 MG tablet Take 1 tablet (15 mg total) by mouth daily. (Patient not taking: Reported on 03/22/2024)   mupirocin  ointment (BACTROBAN ) 2 % Apply 1 Application topically 2 (two) times daily. (Patient  not taking: Reported on 03/22/2024)   sertraline  (ZOLOFT ) 50 MG tablet Take 1 tablet (50 mg total) by mouth daily. (Patient not taking: Reported on 03/22/2024)   triamcinolone  (NASACORT ) 55 MCG/ACT AERO nasal inhaler Place 1 spray into the nose 2 (two) times daily. (Patient not taking: Reported on 03/22/2024)   Vitamin D , Ergocalciferol , (DRISDOL ) 1.25 MG (50000 UNIT) CAPS capsule Take 1 capsule (50,000 Units total) by mouth every 7 (seven) days.   No facility-administered encounter medications on file as of 05/31/2024.    Allergies: has no known allergies.  Physical Examination: Vitals: BP 100/69   Ht 4' 10 (1.473 m)   Wt 121 lb (54.9 kg)   BMI 25.29 kg/m  GENERAL:  Teresa Baldwin is a 23 y.o. female appearing their stated age, alert and oriented x 3, in no apparent distress.  SKIN: no rashes or lesions, skin clean, dry, intact MSK: Left knee without swelling or effusion.  Full range of motion with pain at terminal extension.  Mild tenderness palpation along the medial patellar facets.  Negative patellar apprehension.  No significant medial joint line tenderness.  No lateral joint line tenderness.  Walking without a limp Neurovascularly intact distally   Assessment & Plan Left knee pain status post fall 01/24/2024 with MRI showing irregularity of chondral surface of the patella and concern for possible prior patellar dislocation.  Also mild pes bursitis and fat pad inflammation.  No instability on exam today  - Ongoing knee pain, but has improved somewhat with the brace.  Will continue with the  brace that she is doing - To reschedule physical therapy, has initial appointment tomorrow.  Discussed that PT is likely the best modality to help improve her symptoms at this time.  She should proceed with this as scheduled -Continue NSAIDs as needed - Follow-up in eight weeks to assess progress.     Patient expressed understanding & agreement with above.  Encounter Diagnoses  Name  Primary?   Contusion of left knee, subsequent encounter Yes   Patellar dislocation, left, sequela     No orders of the defined types were placed in this encounter.    Contains text generated by [employer].

## 2024-06-01 ENCOUNTER — Other Ambulatory Visit: Payer: Self-pay

## 2024-06-01 ENCOUNTER — Encounter: Payer: Self-pay | Admitting: Physical Therapy

## 2024-06-01 ENCOUNTER — Ambulatory Visit: Payer: MEDICAID | Attending: Family Medicine | Admitting: Physical Therapy

## 2024-06-01 DIAGNOSIS — M25662 Stiffness of left knee, not elsewhere classified: Secondary | ICD-10-CM | POA: Diagnosis present

## 2024-06-01 DIAGNOSIS — R2689 Other abnormalities of gait and mobility: Secondary | ICD-10-CM | POA: Insufficient documentation

## 2024-06-01 DIAGNOSIS — M25562 Pain in left knee: Secondary | ICD-10-CM | POA: Insufficient documentation

## 2024-06-01 DIAGNOSIS — G8929 Other chronic pain: Secondary | ICD-10-CM | POA: Diagnosis present

## 2024-06-01 NOTE — Therapy (Signed)
 OUTPATIENT PHYSICAL THERAPY LOWER EXTREMITY EVALUATION   Patient Name: Teresa Baldwin MRN: [id number] DOB:[birth year], 23 y.o., female Today's Date: 06/01/2024  END OF SESSION:  PT End of Session - 06/01/24 1043     Visit Number 1    Number of Visits 13    Date for Recertification  07/27/24    Authorization Type [employer]    Progress Note Due on Visit 10    PT Start Time 1045    PT Stop Time 1124    PT Time Calculation (min) 39 min    Activity Tolerance Patient tolerated treatment well    Behavior During Therapy WFL for tasks assessed/performed          Past Medical History:  Diagnosis Date   Anxiety    Depression    Medical history non-contributory    Past Surgical History:  Procedure Laterality Date   CESAREAN SECTION N/A 12/18/2017   Procedure: CESAREAN SECTION;  Surgeon: Eveline Lynwood MATSU, MD;  Location: [facility] BIRTHING SUIT[facility];  Service: Obstetrics;  Laterality: N/A;   NO PAST SURGERIES     Patient Active Problem List   Diagnosis Date Noted   Encounter for hepatitis C screening test for low risk patient 03/22/2024   Need for influenza vaccination 03/22/2024   Screening for cardiovascular condition 03/22/2024   Encounter for general adult medical examination w/o abnormal findings 03/22/2024   Moderate episode of recurrent major depressive disorder (HCC) 09/16/2023   Adjustment disorder with mixed emotional features 09/05/2023   Intellectual developmental disorder, mild 08/11/2023   Vitamin D  deficiency 08/05/2023   Nasal sinus congestion 08/05/2023   Anxiety and depression 08/05/2023   Dry cough 08/05/2023   Establishing care with new doctor, encounter for 08/05/2023   Status post primary low transverse cesarean section 12/18/2017   Uterine fibroid 12/18/2017   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 Pt noted immediate pain in her L knee. Pt had MRI (see diagnostic imaging for details). Pt notes increased symptoms when negotiating stairs up and down. Symptoms range in intensity from 0/10-9/10 depending on activity or position. Symptoms are independent of time of day. Pt describes symptoms as sharp.   PERTINENT HISTORY: Left knee pain status post fall 01/24/2024 with MRI showing irregularity of chondral surface of the patella and concern for possible prior patellar dislocation.  Also mild pes bursitis and fat pad inflammation.  No instability on exam today - Non-surgical, conservative management indicated. - Recommend physical therapy to strengthen thigh muscles and alleviate knee pain. Referral to [facility] on [address] given today - Fitted with a patellar stabilization knee brace today for support. - Recommended restarting Meloxicam  15mg  daily prn for inflammation management. - Follow up in four to six weeks to assess progress. PAIN:  Are you having pain? Yes: NPRS scale: 0/10 Pain location: L knee Pain description: sharp Aggravating factors: movement, stairs Relieving factors: rest  PRECAUTIONS: None  RED FLAGS: None   WEIGHT BEARING RESTRICTIONS: No  FALLS:  Has patient fallen in last 6 months?  Yes. Number of falls 1  LIVING ENVIRONMENT: Lives with: lives with their son Lives in: House/apartment Stairs: Yes: External: flight steps; can reach both Has following equipment at home: Walker - 4 wheeled-fathers  OCCUPATION: not working, caretaker  PLOF: Independent  PATIENT GOALS: no pain, back to work  NEXT  MD VISIT: July 31, 2024-podiatry  OBJECTIVE:  Note: Objective measures were completed at Evaluation unless otherwise noted.  DIAGNOSTIC FINDINGS: IMPRESSION: 1. Intact ligamentous structures and no acute bony findings. 2. No meniscal tears. 3. Mild cartilaginous irregularity along the medial most aspect of the medial facet of the patella suggesting prior injury. 4. Moderate thickening and inflammation/edema along the medial retinacular attachment site on the patella. There is also mild thickening and inflammation/edema in the upper medial aspect of Hoffa's fat. Findings could suggest a prior patellar dislocation injury, retinaculum sprain or partial tear. 5. MCL and pes anserine bursitis. 6. No joint effusion or Baker's cyst.  PATIENT SURVEYS:  LEFS  Extreme difficulty/unable (0), Quite a bit of difficulty (1), Moderate difficulty (2), Little difficulty (3), No difficulty (4) Survey date:  06/01/24  Any of your usual work, housework or school activities 2  2. Usual hobbies, recreational or sporting activities 0  3. Getting into/out of the bath 4  4. Walking between rooms 2  5. Putting on socks/shoes 2  6. Squatting  0  7. Lifting an object, like a bag of groceries from the floor 2  8. Performing light activities around your home 3  9. Performing heavy activities around your home 1  10. Getting into/out of a car 2  11. Walking 2 blocks 2  12. Walking 1 mile 2  13. Going up/down 10 stairs (1 flight) 4  14. Standing for 1 hour 3  15.  sitting for 1 hour 3  16. Running on even ground 2  17. Running on uneven ground 2  18. Making sharp turns while running fast 1  19. Hopping  4  20. Rolling over in bed 4  Score total:  45/80     COGNITION: Overall cognitive status: Within functional limits for tasks assessed     SENSATION: In sensitivity in L thigh with light touch reproducing L knee pain    POSTURE: rounded shoulders and forward head  PALPATION: Pt has decreased  tolerance to palpation, patella appears to be floating with edema superficial and deep. Inc TTP in the L thigh globally  LOWER EXTREMITY ROM: Bil hip ROM WNL  Active ROM Right eval Left eval  Hip flexion    Hip extension    Hip abduction    Hip adduction    Hip internal rotation    Hip external rotation    Knee flexion WFL 111  Knee extension WFL 5  Ankle dorsiflexion    Ankle plantarflexion    Ankle inversion    Ankle eversion     (Blank rows = not tested)  LOWER EXTREMITY MMT:  MMT Right eval Left eval  Hip flexion 4+/5 3+/5  Hip extension 4+/5 4/5  Hip abduction 4/5 3/5  Hip adduction 5/5 4/5  Hip internal rotation    Hip external rotation    Knee flexion 4+/5 4/5  Knee extension 4+/5 3+/5  Ankle dorsiflexion    Ankle plantarflexion    Ankle inversion    Ankle eversion     (Blank rows = not tested)  LOWER EXTREMITY SPECIAL TESTS:  Knee special tests: deferred  FUNCTIONAL TESTS:  deferred  GAIT: Distance walked: lobby  to treatment area Assistive device utilized: None Level of assistance: Complete Independence Comments: Pt has slight delay before initiating steps and first steps show antalgic apprehension with LLE stance phase. Improves as steps progress but this pattern remains.                                                                                                                                 TREATMENT DATE:  [facility] Adult PT Treatment:                                                DATE: 06/01/24 Therapeutic Exercise: Quad set x10 in supine Heel slide x10 in supine Pt edu HEP developed and reviewed   PATIENT EDUCATION:  Education details: Pt educated on relevant anatomy, physiology, pathology, diagnosis, prognosis, progression of care, pain and activity modification related to L knee pain.  Person educated: Patient Education method: Explanation, Demonstration, and Handouts Education comprehension: verbalized understanding and returned  demonstration  HOME EXERCISE PROGRAM: Access Code: [facility] URL: [email or web address] Date: 06/01/2024 Prepared by: Stann Ohara  Exercises - Seated Quad Set  - 3 x daily - 7 x weekly - 1 sets - 10 reps - 2 hold - Supine Heel Slide  - 1 x daily - 7 x weekly - 3 sets - 10 reps - 2 hold - Active Straight Leg Raise with Quad Set  - 1 x daily - 7 x weekly - 1 sets - 10 reps - 2 hold - Side to Side Weight Shift with Counter Support  - 1 x daily - 7 x weekly - 1 sets - 45 reps - 2 hold  ASSESSMENT:  CLINICAL IMPRESSION: Patient is a 23 y.o. F who was seen today for physical therapy evaluation and treatment for L knee pain. Symptoms are consistent with L knee dysfunction following a fall onto the knee in July of 2025. Pt has significant limitations in function due to pain. Pt dons support brace most of the time, and feels it helps to provide stability. Sensory and motor deficits present in the knee capsule and thigh, begin to normalize distal to the knee. Symptoms are intermittent in nature and response to provided tasks today consistent with tissue remodeling principles and gradual progression of activity. Pt stands to benefit from continued skilled physical therapy to address deficit areas and restore safety with activities and participations at home and in the community.    OBJECTIVE IMPAIRMENTS: Abnormal gait, decreased activity tolerance, decreased endurance, decreased mobility, difficulty walking, decreased ROM, decreased strength, increased fascial restrictions, impaired perceived functional ability, impaired sensation, and pain.   ACTIVITY LIMITATIONS: carrying, lifting, bending, standing, squatting, stairs, and caring for others  PARTICIPATION LIMITATIONS: cleaning, laundry, community activity, occupation, and yard work  PERSONAL FACTORS: Profession, Time since onset of injury/illness/exacerbation, and 1 comorbidity: Foot  and ankle pain are also affecting patient's  functional outcome.   REHAB POTENTIAL: Good  CLINICAL DECISION MAKING: Stable/uncomplicated  EVALUATION COMPLEXITY: Low   GOALS: Goals reviewed with patient? Yes  SHORT TERM GOALS: Target date: 07/01/24   Pt will report compliance with HEP to work towards ind and home management strategies Baseline: Goal status: INITIAL   2.  Pt will score no less than 55/80 on LEFS to demonstrate improved activity tolerance Baseline:  Goal status: INITIAL   3.  Pt will improve L knee ROM to at least 120 degrees knee flexion and painless in order to demonstrate progress towards activity tolerance and improved function Baseline: see ROM chart Goal status: INITIAL     LONG TERM GOALS: Target date: 07/27/24   Pt will score no less than 65/80 on LEFS to demonstrate improved activity tolerance Baseline:  Goal status: INITIAL   2.  Pt will report no greater than 2/10 pain over 7 consecutive days to demonstrate maintained reduction in symptoms and improved tolerance to activity Baseline: 0/10-9/10 Goal status: INITIAL   3.  Pt will be ind in the management of their symptoms at home and in the community Baseline:  Goal status: INITIAL   4.  Pt will report being able to climb a flight of stairs without being limited by L knee pain Baseline:  Goal status: INITIAL      PLAN:  PT FREQUENCY: 1-2x/week  PT DURATION: 8 weeks  PLANNED INTERVENTIONS: 97110-Therapeutic exercises, 97530- Therapeutic activity, [id number]- Neuromuscular re-education, 97535- Self Care, 02859- Manual therapy, [phone]- Gait training, [phone]- Electrical stimulation (unattended), 97016- Vasopneumatic device, 20560 (1-2 muscles), 20561 (3+ muscles)- Dry Needling, Patient/Family education, Cryotherapy, and Moist heat  PLAN FOR NEXT SESSION: progress L knee ROM, strength, stability through functional movement patterns, update HEP as indicated   Stann DELENA Ohara, PT 06/01/2024, 12:43 PM

## 2024-06-06 ENCOUNTER — Ambulatory Visit: Payer: MEDICAID | Admitting: Physical Therapy

## 2024-06-14 ENCOUNTER — Ambulatory Visit: Payer: MEDICAID

## 2024-06-14 DIAGNOSIS — M25562 Pain in left knee: Secondary | ICD-10-CM | POA: Diagnosis present

## 2024-06-14 DIAGNOSIS — G8929 Other chronic pain: Secondary | ICD-10-CM | POA: Diagnosis present

## 2024-06-14 DIAGNOSIS — R2689 Other abnormalities of gait and mobility: Secondary | ICD-10-CM | POA: Insufficient documentation

## 2024-06-14 DIAGNOSIS — M25662 Stiffness of left knee, not elsewhere classified: Secondary | ICD-10-CM | POA: Diagnosis present

## 2024-06-14 NOTE — Therapy (Signed)
 OUTPATIENT PHYSICAL THERAPY TREATMENT NOTE   Patient Name: Teresa Baldwin MRN: [id number] DOB:07-29-2000, 23 y.o., female Today's Date: 06/14/2024  END OF SESSION:  PT End of Session - 06/14/24 0945     Visit Number 2    Number of Visits 13    Date for Recertification  07/27/24    Authorization Type [employer]    Progress Note Due on Visit 10    PT Start Time 1000    PT Stop Time 1040    PT Time Calculation (min) 40 min    Activity Tolerance Patient tolerated treatment well    Behavior During Therapy [facility] for tasks assessed/performed          Past Medical History:  Diagnosis Date   Anxiety    Depression    Medical history non-contributory    Past Surgical History:  Procedure Laterality Date   CESAREAN SECTION N/A 12/18/2017   Procedure: CESAREAN SECTION;  Surgeon: Eveline Lynwood MATSU, MD;  Location: [facility];  Service: Obstetrics;  Laterality: N/A;   NO PAST SURGERIES     Patient Active Problem List   Diagnosis Date Noted   Encounter for hepatitis C screening test for low risk patient 03/22/2024   Need for influenza vaccination 03/22/2024   Screening for cardiovascular condition 03/22/2024   Encounter for general adult medical examination w/o abnormal findings 03/22/2024   Moderate episode of recurrent major depressive disorder (HCC) 09/16/2023   Adjustment disorder with mixed emotional features 09/05/2023   Intellectual developmental disorder, mild 08/11/2023   Vitamin D  deficiency 08/05/2023   Nasal sinus congestion 08/05/2023   Anxiety and depression 08/05/2023   Dry cough 08/05/2023   Establishing care with new doctor, encounter for 08/05/2023   Status post primary low transverse cesarean section 12/18/2017   Uterine fibroid 12/18/2017   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  EVAL: Pt fell in July and slipped and hit her knee. Pt noted immediate pain in her L knee. Pt had MRI (see diagnostic imaging for details). Pt notes increased symptoms when negotiating stairs up and down. Symptoms range in intensity from 0/10-9/10 depending on activity or position. Symptoms are independent of time of day. Pt describes symptoms as sharp.   PERTINENT HISTORY: Left knee pain status post fall 01/24/2024 with MRI showing irregularity of chondral surface of the patella and concern for possible prior patellar dislocation.  Also mild pes bursitis and fat pad inflammation.  No instability on exam today - Non-surgical, conservative management indicated. - Recommend physical therapy to strengthen thigh muscles and alleviate knee pain. Referral to [facility] on [address] given today - Fitted with a patellar stabilization knee brace today for support. - Recommended restarting Meloxicam  15mg  daily prn for inflammation management. - Follow up in four to six weeks to assess progress. PAIN:  Are you having pain? Yes: NPRS scale: 0/10 Pain location: L knee Pain description: sharp Aggravating factors: movement, stairs Relieving factors: rest  PRECAUTIONS: None  RED FLAGS: None  WEIGHT BEARING RESTRICTIONS: No  FALLS:  Has patient fallen in last 6 months? Yes. Number of falls 1  LIVING ENVIRONMENT: Lives with: lives with their son Lives in: House/apartment Stairs: Yes: External: flight steps; can reach both Has following equipment at home: Walker - 4 wheeled-fathers  OCCUPATION: not working,  caretaker  PLOF: Independent  PATIENT GOALS: no pain, back to work  NEXT MD VISIT: July 31, 2024-podiatry  OBJECTIVE:  Note: Objective measures were completed at Evaluation unless otherwise noted.  DIAGNOSTIC FINDINGS: IMPRESSION: 1. Intact ligamentous structures and no acute bony findings. 2. No meniscal tears. 3. Mild cartilaginous irregularity along the medial most aspect of the medial facet of the patella suggesting prior injury. 4. Moderate thickening and inflammation/edema along the medial retinacular attachment site on the patella. There is also mild thickening and inflammation/edema in the upper medial aspect of Hoffa's fat. Findings could suggest a prior patellar dislocation injury, retinaculum sprain or partial tear. 5. MCL and pes anserine bursitis. 6. No joint effusion or Baker's cyst.  PATIENT SURVEYS:  LEFS  Extreme difficulty/unable (0), Quite a bit of difficulty (1), Moderate difficulty (2), Little difficulty (3), No difficulty (4) Survey date:  06/01/24  Any of your usual work, housework or school activities 2  2. Usual hobbies, recreational or sporting activities 0  3. Getting into/out of the bath 4  4. Walking between rooms 2  5. Putting on socks/shoes 2  6. Squatting  0  7. Lifting an object, like a bag of groceries from the floor 2  8. Performing light activities around your home 3  9. Performing heavy activities around your home 1  10. Getting into/out of a car 2  11. Walking 2 blocks 2  12. Walking 1 mile 2  13. Going up/down 10 stairs (1 flight) 4  14. Standing for 1 hour 3  15.  sitting for 1 hour 3  16. Running on even ground 2  17. Running on uneven ground 2  18. Making sharp turns while running fast 1  19. Hopping  4  20. Rolling over in bed 4  Score total:  45/80     COGNITION: Overall cognitive status: Within functional limits for tasks assessed     SENSATION: In sensitivity in L thigh with light touch reproducing L knee  pain    POSTURE: rounded shoulders and forward head  PALPATION: Pt has decreased tolerance to palpation, patella appears to be floating with edema superficial and deep. Inc TTP in the L thigh globally  LOWER EXTREMITY ROM: Bil hip ROM WNL  Active ROM Right eval Left eval  Hip flexion    Hip extension    Hip abduction    Hip adduction    Hip internal rotation    Hip external rotation    Knee flexion WFL 111  Knee extension WFL 5  Ankle dorsiflexion    Ankle plantarflexion    Ankle inversion    Ankle eversion     (Blank rows = not tested)  LOWER EXTREMITY MMT:  MMT Right eval Left eval  Hip flexion 4+/5 3+/5  Hip extension 4+/5 4/5  Hip abduction 4/5 3/5  Hip adduction 5/5 4/5  Hip internal rotation    Hip external rotation    Knee flexion 4+/5 4/5  Knee extension 4+/5 3+/5  Ankle dorsiflexion    Ankle plantarflexion    Ankle inversion    Ankle eversion     (Blank rows = not tested)  LOWER EXTREMITY SPECIAL TESTS:  Knee special tests: deferred  FUNCTIONAL TESTS:  deferred  GAIT: Distance walked: lobby to treatment area Assistive device utilized: None Level of assistance: Complete Independence Comments: Pt has slight delay before initiating steps and first steps show antalgic apprehension with LLE stance phase. Improves as steps progress but this pattern remains.                                                                                                                                 TREATMENT DATE:  [facility] Adult PT Treatment:                                                DATE: 06/14/24 Therapeutic Exercise: Nustep level 2 x 5 mins while gathering subjective, working on knee flexion ROM Seated LAQ LLE 2x10 Seated hip adduction ball squeeze 5 hold 2x10 Supine heel slide 5 hold 2x15 Supine bridges 2x10 SL clamshells 2x10 Seated hamstring curl LLE RTB 2x10 Neuromuscular re-ed: TKE against ball on wall 5 hold 2x10 LLE Supine quad set 5 hold  2x10 LLE Supine SLR with quad set x10 LLE SAQ LLE 2x10 2# Therapeutic Activity: STS with LLE back to encourage weight shift onto LLE 2x10   [facility] Adult PT Treatment:                                                DATE: 06/01/24 Therapeutic Exercise: Quad set x10 in supine Heel slide x10 in supine Pt edu HEP developed and reviewed   PATIENT EDUCATION:  Education details: Pt educated on relevant anatomy, physiology, pathology, diagnosis, prognosis, progression of care, pain and activity modification related to L knee pain.  Person educated: Patient Education method: Explanation, Demonstration, and Handouts Education comprehension: verbalized understanding and returned demonstration  HOME EXERCISE PROGRAM: Access Code: [facility] URL: [email or web address] Date: 06/01/2024 Prepared by: Stann Ohara  Exercises - Seated Quad Set  - 3 x daily - 7 x weekly - 1 sets - 10 reps - 2 hold - Supine Heel Slide  - 1 x daily - 7 x weekly - 3 sets - 10 reps - 2 hold - Active Straight Leg Raise with Quad Set  - 1 x daily - 7 x weekly - 1 sets - 10 reps - 2 hold - Side to Side Weight Shift with Counter Support  - 1 x daily - 7 x weekly - 1 sets - 45 reps - 2 hold  ASSESSMENT:  CLINICAL IMPRESSION: Patient presents to PT reporting that she isn't having as much pain as she was before. Reports compliant with HEP. Today's session focused on quad activation and LE strengthening. Patient tolerated exercises well, reports some muscular fatigue with  exercises. Patient required verbal and tactile cues for correct technique of exercises. Patient will benefit from skilled PT in order to increase functional independence.   EVAL: Patient is a 23 y.o. F who was seen today for physical therapy evaluation and treatment for L knee pain. Symptoms are consistent with L knee dysfunction following a fall onto the knee in July of 2025. Pt has significant limitations in function due to pain. Pt dons support  brace most of the time, and feels it helps to provide stability. Sensory and motor deficits present in the knee capsule and thigh, begin to normalize distal to the knee. Symptoms are intermittent in nature and response to provided tasks today consistent with tissue remodeling principles and gradual progression of activity. Pt stands to benefit from continued skilled physical therapy to address deficit areas and restore safety with activities and participations at home and in the community.    OBJECTIVE IMPAIRMENTS: Abnormal gait, decreased activity tolerance, decreased endurance, decreased mobility, difficulty walking, decreased ROM, decreased strength, increased fascial restrictions, impaired perceived functional ability, impaired sensation, and pain.   ACTIVITY LIMITATIONS: carrying, lifting, bending, standing, squatting, stairs, and caring for others  PARTICIPATION LIMITATIONS: cleaning, laundry, community activity, occupation, and yard work  PERSONAL FACTORS: Profession, Time since onset of injury/illness/exacerbation, and 1 comorbidity: Foot and ankle pain are also affecting patient's functional outcome.   REHAB POTENTIAL: Good  CLINICAL DECISION MAKING: Stable/uncomplicated  EVALUATION COMPLEXITY: Low   GOALS: Goals reviewed with patient? Yes  SHORT TERM GOALS: Target date: 07/01/24   Pt will report compliance with HEP to work towards ind and home management strategies Baseline: Goal status: INITIAL   2.  Pt will score no less than 55/80 on LEFS to demonstrate improved activity tolerance Baseline:  Goal status: INITIAL   3.  Pt will improve L knee ROM to at least 120 degrees knee flexion and painless in order to demonstrate progress towards activity tolerance and improved function Baseline: see ROM chart Goal status: INITIAL     LONG TERM GOALS: Target date: 07/27/24   Pt will score no less than 65/80 on LEFS to demonstrate improved activity tolerance Baseline:  Goal  status: INITIAL   2.  Pt will report no greater than 2/10 pain over 7 consecutive days to demonstrate maintained reduction in symptoms and improved tolerance to activity Baseline: 0/10-9/10 Goal status: INITIAL   3.  Pt will be ind in the management of their symptoms at home and in the community Baseline:  Goal status: INITIAL   4.  Pt will report being able to climb a flight of stairs without being limited by L knee pain Baseline:  Goal status: INITIAL      PLAN:  PT FREQUENCY: 1-2x/week  PT DURATION: 8 weeks  PLANNED INTERVENTIONS: 97110-Therapeutic exercises, 97530- Therapeutic activity, [id number]- Neuromuscular re-education, 97535- Self Care, [id number]- Manual therapy, [phone]- Gait training, [phone]- Electrical stimulation (unattended), 97016- Vasopneumatic device, 20560 (1-2 muscles), 20561 (3+ muscles)- Dry Needling, Patient/Family education, Cryotherapy, and Moist heat  PLAN FOR NEXT SESSION: progress L knee ROM, strength, stability through functional movement patterns, update HEP as indicated   Shanda Code, SPTA 06/14/2024, 9:46 AM

## 2024-06-15 ENCOUNTER — Other Ambulatory Visit: Payer: Self-pay

## 2024-06-15 ENCOUNTER — Emergency Department (HOSPITAL_COMMUNITY)
Admission: EM | Admit: 2024-06-15 | Discharge: 2024-06-15 | Disposition: A | Discharge status: Home / Self Care | Payer: MEDICAID | Attending: Emergency Medicine | Admitting: Emergency Medicine

## 2024-06-15 ENCOUNTER — Encounter (HOSPITAL_COMMUNITY): Payer: Self-pay

## 2024-06-15 DIAGNOSIS — K029 Dental caries, unspecified: Secondary | ICD-10-CM | POA: Insufficient documentation

## 2024-06-15 DIAGNOSIS — K0889 Other specified disorders of teeth and supporting structures: Secondary | ICD-10-CM

## 2024-06-15 HISTORY — DX: Unspecified intracranial injury with loss of consciousness status unknown, initial encounter: S06.9XAA

## 2024-06-15 LAB — COMPREHENSIVE METABOLIC PANEL WITH GFR
ALT: 16 U/L (ref 0–44)
AST: 22 U/L (ref 15–41)
Albumin: 3.9 g/dL (ref 3.5–5.0)
Alkaline Phosphatase: 46 U/L (ref 38–126)
Anion gap: 5 (ref 5–15)
BUN: 10 mg/dL (ref 6–20)
CO2: 27 mmol/L (ref 22–32)
Calcium: 9.2 mg/dL (ref 8.9–10.3)
Chloride: 105 mmol/L (ref 98–111)
Creatinine, Ser: 0.7 mg/dL (ref 0.44–1.00)
GFR, Estimated: >60 mL/min (ref >60)
Glucose, Bld: 111 mg/dL — ABNORMAL HIGH (ref 70–99)
Potassium: 3.7 mmol/L (ref 3.5–5.1)
Sodium: 137 mmol/L (ref 135–145)
Total Bilirubin: 0.6 mg/dL (ref 0.0–1.2)
Total Protein: 8 g/dL (ref 6.5–8.1)

## 2024-06-15 LAB — CBC WITH DIFFERENTIAL/PLATELET
Abs Immature Granulocytes: 0.02 K/uL (ref 0.00–0.07)
Basophils Absolute: 0 K/uL (ref 0.0–0.1)
Basophils Relative: 0 %
Eosinophils Absolute: 0.1 K/uL (ref 0.0–0.5)
Eosinophils Relative: 1 %
HCT: 34 % — ABNORMAL LOW (ref 36.0–46.0)
Hemoglobin: 11.9 g/dL — ABNORMAL LOW (ref 12.0–15.0)
Immature Granulocytes: 0 %
Lymphocytes Relative: 42 %
Lymphs Abs: 2.8 K/uL (ref 0.7–4.0)
MCH: 28.3 pg (ref 26.0–34.0)
MCHC: 35 g/dL (ref 30.0–36.0)
MCV: 81 fL (ref 80.0–100.0)
Monocytes Absolute: 0.5 K/uL (ref 0.1–1.0)
Monocytes Relative: 7 %
Neutro Abs: 3.3 K/uL (ref 1.7–7.7)
Neutrophils Relative %: 50 %
Platelets: 326 K/uL (ref 150–400)
RBC: 4.2 MIL/uL (ref 3.87–5.11)
RDW: 13.6 % (ref 11.5–15.5)
WBC: 6.7 K/uL (ref 4.0–10.5)
nRBC: 0 % (ref 0.0–0.2)

## 2024-06-15 LAB — HCG, SERUM, QUALITATIVE: Preg, Serum: NEGATIVE

## 2024-06-15 MED ORDER — KETOROLAC TROMETHAMINE 15 MG/ML IJ SOLN
15.0000 mg | Freq: Once | INTRAMUSCULAR | Status: AC
  Administered 2024-06-15: 15 mg via INTRAMUSCULAR
  Filled 2024-06-15: qty 1

## 2024-06-15 MED ORDER — AMOXICILLIN 500 MG PO CAPS
1000.0000 mg | ORAL_CAPSULE | Freq: Once | ORAL | Status: AC
  Administered 2024-06-15: 1000 mg via ORAL
  Filled 2024-06-15: qty 2

## 2024-06-15 MED ORDER — AMOXICILLIN 500 MG PO CAPS: 1000.0000 mg | ORAL_CAPSULE | Freq: Two times a day (BID) | ORAL | 0 refills | Status: AC

## 2024-06-15 NOTE — ED Triage Notes (Signed)
 Pt BIB [facility]. Pt presents with a HA with gradual onset 3 days ago described as a pressure behind her eyes and nose.   EMS Vitals   110/72 HR 102 SpO2 97% on RA  RR 16

## 2024-06-15 NOTE — ED Provider Notes (Signed)
 [place] EMERGENCY DEPARTMENT AT [facility] Provider Note   CSN: [id number] Arrival date & time: 06/15/24  1540     Patient presents with: Headache   Teresa Baldwin is a 23 y.o. female.   23 yo F with a chief complaint of a right-sided headache.  This has been a recurrent issue for her.  Seems to come and go.  Worse over the past couple days.  Feels like it is worse at the gumline.  Denies trauma to the area.  Denies fevers.  Nuys cough or congestion.  Denies one-sided numbness or weakness or difficulty speech or swallowing.   Headache      Prior to Admission medications   Medication Sig Start Date End Date Taking? Authorizing Provider  amoxicillin  (AMOXIL ) 500 MG capsule Take 2 capsules (1,000 mg total) by mouth 2 (two) times daily. 06/15/24  Yes Emil Share, DO  docusate sodium  (COLACE) 100 MG capsule Take 1 capsule (100 mg total) by mouth 2 (two) times daily. 01/25/18   Degele, Mliss SQUIBB, MD  ferrous sulfate  300 (60 Fe) MG/5ML syrup Take 10 mLs (600 mg total) by mouth daily with breakfast. Patient not taking: Reported on 06/01/2024 11/02/17   Jerilynn Longs, NP  ferrous sulfate  300 (60 Fe) MG/5ML syrup Take 10 mLs (600 mg total) by mouth 2 (two) times daily with a meal. Patient not taking: Reported on 06/01/2024 12/21/17   Hipkins, Laymon SQUIBB, MD  ibuprofen  (ADVIL ) 600 MG tablet Take 1 tablet (600 mg total) by mouth every 6 (six) hours as needed. Patient not taking: Reported on 06/01/2024 01/24/24   Johnie Flaming A, NP  meloxicam  (MOBIC ) 15 MG tablet Take 1 tablet (15 mg total) by mouth daily. 02/16/24   Teressa Clock C, DO  mupirocin  ointment (BACTROBAN ) 2 % Apply 1 Application topically 2 (two) times daily. 01/24/24   Johnie Flaming A, NP  sertraline  (ZOLOFT ) 50 MG tablet Take 1 tablet (50 mg total) by mouth daily. 09/16/23 06/12/24  Petrina Pries, NP  triamcinolone  (NASACORT ) 55 MCG/ACT AERO nasal inhaler Place 1 spray into the nose 2 (two) times daily. 08/05/23 06/01/24   Petrina Pries, NP  Vitamin D , Ergocalciferol , (DRISDOL ) 1.25 MG (50000 UNIT) CAPS capsule Take 1 capsule (50,000 Units total) by mouth every 7 (seven) days. 09/16/23   Petrina Pries, NP    Allergies: Patient has no allergy information on record.    Review of Systems  Neurological:  Positive for headaches.    Updated Vital Signs BP 103/73 (BP Location: Right Arm)   Pulse 88   Temp 97.8 F (36.6 C)   Resp 16   Ht 4' 10 (1.473 m)   Wt 54.9 kg   LMP 06/11/2024   SpO2 99%   BMI 25.29 kg/m   Physical Exam Vitals and nursing note reviewed.  Constitutional:      General: She is not in acute distress.    Appearance: She is well-developed. She is not diaphoretic.  HENT:     Head: Normocephalic and atraumatic.     Mouth/Throat:     Comments: Patient with significant discomfort along the right frontal and right first incisor along the gumline.  There do appear to be caries to the base of the teeth diffusely.  She also has an extra row of teeth just posterior to her adult teeth. Eyes:     Pupils: Pupils are equal, round, and reactive to light.  Cardiovascular:     Rate and Rhythm: Normal rate and regular rhythm.  Heart sounds: No murmur heard.    No friction rub. No gallop.  Pulmonary:     Effort: Pulmonary effort is normal.     Breath sounds: No wheezing or rales.  Abdominal:     General: There is no distension.     Palpations: Abdomen is soft.     Tenderness: There is no abdominal tenderness.  Musculoskeletal:        General: No tenderness.     Cervical back: Normal range of motion and neck supple.  Skin:    General: Skin is warm and dry.  Neurological:     Mental Status: She is alert and oriented to person, place, and time.  Psychiatric:        Behavior: Behavior normal.     (all labs ordered are listed, but only abnormal results are displayed) Labs Reviewed  COMPREHENSIVE METABOLIC PANEL WITH GFR - Abnormal; Notable for the following components:      Result Value    Glucose, Bld 111 (*)    All other components within normal limits  CBC WITH DIFFERENTIAL/PLATELET - Abnormal; Notable for the following components:   Hemoglobin 11.9 (*)    HCT 34.0 (*)    All other components within normal limits  HCG, SERUM, QUALITATIVE    EKG: None  Radiology: No results found.   Procedures   Medications Ordered in the ED  ketorolac  (TORADOL ) 15 MG/ML injection 15 mg (15 mg Intramuscular Given 06/15/24 2206)  amoxicillin  (AMOXIL ) capsule 1,000 mg (1,000 mg Oral Given 06/15/24 2206)                                    Medical Decision Making Risk Prescription drug management.   23 yo F with a chief complaint of right upper dental pain.  This been going on for about 3 days.  She has had this problem off and on for some time.  She feels like her right side of her face is swollen.  I do not appreciate any obvious facial edema.  She does seem to have significant dental pain.  No obvious areas suspicious for dental abscess.  Will start on oral antibiotics.  Given information to follow-up with the dentist.  11:09 PM:  I have discussed the diagnosis/risks/treatment options with the patient.  Evaluation and diagnostic testing in the emergency department does not suggest an emergent condition requiring admission or immediate intervention beyond what has been performed at this time.  They will follow up with Dentistry. We also discussed returning to the ED immediately if new or worsening sx occur. We discussed the sx which are most concerning (e.g., sudden worsening pain, fever, inability to tolerate by mouth) that necessitate immediate return. Medications administered to the patient during their visit and any new prescriptions provided to the patient are listed below.  Medications given during this visit Medications  ketorolac  (TORADOL ) 15 MG/ML injection 15 mg (15 mg Intramuscular Given 06/15/24 2206)  amoxicillin  (AMOXIL ) capsule 1,000 mg (1,000 mg Oral Given  06/15/24 2206)     The patient appears reasonably screen and/or stabilized for discharge and I doubt any other medical condition or other [facility] requiring further screening, evaluation, or treatment in the ED at this time prior to discharge.       Final diagnoses:  Pain, dental    ED Discharge Orders          Ordered    amoxicillin  (AMOXIL ) 500  MG capsule  2 times daily        06/15/24 2152               Emil Share, [place] 06/15/24 2309

## 2024-06-15 NOTE — Discharge Instructions (Signed)
 Please try to follow-up with a dentist.  Please return for rapid worsening or if you develop a fever.  Max dosing of the over-the-counter medicines as listed below.  Take 4 over the counter ibuprofen  tablets 3 times a day or 2 over-the-counter naproxen tablets twice a day for pain. Also take tylenol  1000mg (2 extra strength) four times a day.

## 2024-06-15 NOTE — ED Provider Triage Note (Signed)
 Emergency Medicine Provider Triage Evaluation Note  Teresa Baldwin , a 23 y.o. female  was evaluated in triage.  Pt complains of right sided headache for the past three days. Denies sudden onset. Denies trauma or falls. Denies trouble walking or talking. No nausea, vomiting, or vision changes. Denies neck pain or stiffness.  Review of Systems  Positive:  Negative:   Physical Exam  BP 108/79 (BP Location: Left Arm)   Pulse 97   Temp 98 F (36.7 C) (Oral)   Resp 15   Ht 4' 10 (1.473 m)   Wt 54.9 kg   LMP 06/11/2024   SpO2 98%   BMI 25.29 kg/m  Gen:   Awake, no distress   Resp:  Normal effort  MSK:   Moves extremities without difficulty  Other:  Cranial nerves grossly intact. Strength intact in the upper and lower bilateral extremities.   Medical Decision Making  Medically screening exam initiated at 4:29 PM.  Appropriate orders placed.  Teresa Baldwin was informed that the remainder of the evaluation will be completed by another provider, this initial triage assessment does not replace that evaluation, and the importance of remaining in the ED until their evaluation is complete.  Labs ordered.   Teresa Baldwin, [place] 06/15/24 1630

## 2024-06-18 ENCOUNTER — Ambulatory Visit: Payer: MEDICAID

## 2024-06-18 NOTE — Therapy (Incomplete)
 OUTPATIENT PHYSICAL THERAPY TREATMENT NOTE   Patient Name: Teresa Baldwin MRN: [id number] DOB:05/27/2001, 23 y.o., female Today's Date: 06/18/2024  END OF SESSION:    Past Medical History:  Diagnosis Date   Anxiety    Depression    Medical history non-contributory    Traumatic brain injury [facility])    Past Surgical History:  Procedure Laterality Date   CESAREAN SECTION N/A 12/18/2017   Procedure: CESAREAN SECTION;  Surgeon: Eveline Lynwood MATSU, MD;  Location: [facility] BIRTHING SUITES;  Service: Obstetrics;  Laterality: N/A;   NO PAST SURGERIES     Patient Active Problem List   Diagnosis Date Noted   Encounter for hepatitis C screening test for low risk patient 03/22/2024   Need for influenza vaccination 03/22/2024   Screening for cardiovascular condition 03/22/2024   Encounter for general adult medical examination w/o abnormal findings 03/22/2024   Moderate episode of recurrent major depressive disorder (HCC) 09/16/2023   Adjustment disorder with mixed emotional features 09/05/2023   Intellectual developmental disorder, mild 08/11/2023   Vitamin D  deficiency 08/05/2023   Nasal sinus congestion 08/05/2023   Anxiety and depression 08/05/2023   Dry cough 08/05/2023   Establishing care with new doctor, encounter for 08/05/2023   Status post primary low transverse cesarean section 12/18/2017   Uterine fibroid 12/18/2017   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  Rationale for Evaluation and Treatment: Rehabilitation  ONSET DATE: 01/24/24  SUBJECTIVE:   SUBJECTIVE STATEMENT: ***  Patient states that the pain isn't too bad today, has been compliant with HEP.  EVAL: Pt fell in July and slipped and hit her knee. Pt noted immediate pain in her L  knee. Pt had MRI (see diagnostic imaging for details). Pt notes increased symptoms when negotiating stairs up and down. Symptoms range in intensity from 0/10-9/10 depending on activity or position. Symptoms are independent of time of day. Pt describes symptoms as sharp.   PERTINENT HISTORY: Left knee pain status post fall 01/24/2024 with MRI showing irregularity of chondral surface of the patella and concern for possible prior patellar dislocation.  Also mild pes bursitis and fat pad inflammation.  No instability on exam today - Non-surgical, conservative management indicated. - Recommend physical therapy to strengthen thigh muscles and alleviate knee pain. Referral to [facility] Physical Therapy on [address] given today - Fitted with a patellar stabilization knee brace today for support. - Recommended restarting Meloxicam  15mg  daily prn for inflammation management. - Follow up in four to six weeks to assess progress. PAIN:  Are you having pain? Yes: NPRS scale: 0/10 Pain location: L knee Pain description: sharp Aggravating factors: movement, stairs Relieving factors: rest  PRECAUTIONS: None  RED FLAGS: None   WEIGHT BEARING RESTRICTIONS: No  FALLS:  Has patient fallen in last 6 months? Yes. Number of falls 1  LIVING ENVIRONMENT: Lives with: lives with their son Lives in: House/apartment Stairs: Yes: External: flight steps; can reach both Has following equipment at home: Walker - 4 wheeled-fathers  OCCUPATION: not working, caretaker  PLOF: Independent  PATIENT GOALS: no pain, back to work  NEXT MD VISIT: July 31, 2024-podiatry  OBJECTIVE:  Note: Objective measures were completed at Evaluation unless otherwise noted.  DIAGNOSTIC FINDINGS: IMPRESSION: 1. Intact ligamentous structures and no acute bony findings. 2. No meniscal tears.  3. Mild cartilaginous irregularity along the medial most aspect of the medial facet of the patella suggesting prior injury. 4. Moderate  thickening and inflammation/edema along the medial retinacular attachment site on the patella. There is also mild thickening and inflammation/edema in the upper medial aspect of Hoffa's fat. Findings could suggest a prior patellar dislocation injury, retinaculum sprain or partial tear. 5. MCL and pes anserine bursitis. 6. No joint effusion or Baker's cyst.  PATIENT SURVEYS:  LEFS  Extreme difficulty/unable (0), Quite a bit of difficulty (1), Moderate difficulty (2), Little difficulty (3), No difficulty (4) Survey date:  06/01/24  Any of your usual work, housework or school activities 2  2. Usual hobbies, recreational or sporting activities 0  3. Getting into/out of the bath 4  4. Walking between rooms 2  5. Putting on socks/shoes 2  6. Squatting  0  7. Lifting an object, like a bag of groceries from the floor 2  8. Performing light activities around your home 3  9. Performing heavy activities around your home 1  10. Getting into/out of a car 2  11. Walking 2 blocks 2  12. Walking 1 mile 2  13. Going up/down 10 stairs (1 flight) 4  14. Standing for 1 hour 3  15.  sitting for 1 hour 3  16. Running on even ground 2  17. Running on uneven ground 2  18. Making sharp turns while running fast 1  19. Hopping  4  20. Rolling over in bed 4  Score total:  45/80     COGNITION: Overall cognitive status: Within functional limits for tasks assessed     SENSATION: In sensitivity in L thigh with light touch reproducing L knee pain    POSTURE: rounded shoulders and forward head  PALPATION: Pt has decreased tolerance to palpation, patella appears to be floating with edema superficial and deep. Inc TTP in the L thigh globally  LOWER EXTREMITY ROM: Bil hip ROM WNL  Active ROM Right eval Left eval  Hip flexion    Hip extension    Hip abduction    Hip adduction    Hip internal rotation    Hip external rotation    Knee flexion WFL 111  Knee extension WFL 5  Ankle dorsiflexion     Ankle plantarflexion    Ankle inversion    Ankle eversion     (Blank rows = not tested)  LOWER EXTREMITY MMT:  MMT Right eval Left eval  Hip flexion 4+/5 3+/5  Hip extension 4+/5 4/5  Hip abduction 4/5 3/5  Hip adduction 5/5 4/5  Hip internal rotation    Hip external rotation    Knee flexion 4+/5 4/5  Knee extension 4+/5 3+/5  Ankle dorsiflexion    Ankle plantarflexion    Ankle inversion    Ankle eversion     (Blank rows = not tested)  LOWER EXTREMITY SPECIAL TESTS:  Knee special tests: deferred  FUNCTIONAL TESTS:  deferred  GAIT: Distance walked: lobby to treatment area Assistive device utilized: None Level of assistance: Complete Independence Comments: Pt has slight delay before initiating steps and first steps show antalgic apprehension with LLE stance phase. Improves as steps progress but this pattern remains.  TREATMENT DATE: [facility] Adult PT Treatment:                                                DATE: 06/18/24 Therapeutic Exercise: Nustep level 2 x 5 mins Seated LAQ LLE 2x10 #2 Seated hip adduction ball squeeze 5 hold 2x10 Supine heel slide 5 hold 2x15 Supine bridges 2x10 Seated hip fallouts RTB 2x10 BIL Seated hamstring curl LLE RTB 2x10 Neuromuscular re-ed: TKE against ball on wall 5 hold 2x10 LLE Supine quad set 5 hold 2x10 LLE Supine SLR with quad set x10 LLE SAQ LLE 2x10 2# Therapeutic Activity: STS with LLE back to encourage weight shift onto LLE 2x10  [facility] Adult PT Treatment:                                                DATE: 06/14/24 Therapeutic Exercise: Nustep level 2 x 5 mins while gathering subjective, working on knee flexion ROM Seated LAQ LLE 2x10 Seated hip adduction ball squeeze 5 hold 2x10 Supine heel slide 5 hold 2x15 Supine bridges 2x10 SL clamshells 2x10 Seated hamstring curl LLE RTB  2x10 Neuromuscular re-ed: TKE against ball on wall 5 hold 2x10 LLE Supine quad set 5 hold 2x10 LLE Supine SLR with quad set x10 LLE SAQ LLE 2x10 2# Therapeutic Activity: STS with LLE back to encourage weight shift onto LLE 2x10   [facility] Adult PT Treatment:                                                DATE: 06/01/24 Therapeutic Exercise: Quad set x10 in supine Heel slide x10 in supine Pt edu HEP developed and reviewed   PATIENT EDUCATION:  Education details: Pt educated on relevant anatomy, physiology, pathology, diagnosis, prognosis, progression of care, pain and activity modification related to L knee pain.  Person educated: Patient Education method: Explanation, Demonstration, and Handouts Education comprehension: verbalized understanding and returned demonstration  HOME EXERCISE PROGRAM: Access Code: [facility] URL: [email or web address] Date: 06/01/2024 Prepared by: Stann Ohara  Exercises - Seated Quad Set  - 3 x daily - 7 x weekly - 1 sets - 10 reps - 2 hold - Supine Heel Slide  - 1 x daily - 7 x weekly - 3 sets - 10 reps - 2 hold - Active Straight Leg Raise with Quad Set  - 1 x daily - 7 x weekly - 1 sets - 10 reps - 2 hold - Side to Side Weight Shift with Counter Support  - 1 x daily - 7 x weekly - 1 sets - 45 reps - 2 hold  ASSESSMENT:  CLINICAL IMPRESSION: ***  Patient presents to PT reporting that she isn't having as much pain as she was before. Reports compliant with HEP. Today's session focused on quad activation and LE strengthening. Patient tolerated exercises well, reports some muscular fatigue with exercises. Patient required verbal and tactile cues for correct technique of exercises. Patient will benefit from skilled PT in order to increase functional independence.   EVAL: Patient is a 23 y.o. F who was seen today for physical therapy evaluation  and treatment for L knee pain. Symptoms are consistent with L knee dysfunction following a fall  onto the knee in July of 2025. Pt has significant limitations in function due to pain. Pt dons support brace most of the time, and feels it helps to provide stability. Sensory and motor deficits present in the knee capsule and thigh, begin to normalize distal to the knee. Symptoms are intermittent in nature and response to provided tasks today consistent with tissue remodeling principles and gradual progression of activity. Pt stands to benefit from continued skilled physical therapy to address deficit areas and restore safety with activities and participations at home and in the community.    OBJECTIVE IMPAIRMENTS: Abnormal gait, decreased activity tolerance, decreased endurance, decreased mobility, difficulty walking, decreased ROM, decreased strength, increased fascial restrictions, impaired perceived functional ability, impaired sensation, and pain.   ACTIVITY LIMITATIONS: carrying, lifting, bending, standing, squatting, stairs, and caring for others  PARTICIPATION LIMITATIONS: cleaning, laundry, community activity, occupation, and yard work  PERSONAL FACTORS: Profession, Time since onset of injury/illness/exacerbation, and 1 comorbidity: Foot and ankle pain are also affecting patient's functional outcome.   REHAB POTENTIAL: Good  CLINICAL DECISION MAKING: Stable/uncomplicated  EVALUATION COMPLEXITY: Low   GOALS: Goals reviewed with patient? Yes  SHORT TERM GOALS: Target date: 07/01/24   Pt will report compliance with HEP to work towards ind and home management strategies Baseline: Goal status: INITIAL   2.  Pt will score no less than 55/80 on LEFS to demonstrate improved activity tolerance Baseline:  Goal status: INITIAL   3.  Pt will improve L knee ROM to at least 120 degrees knee flexion and painless in order to demonstrate progress towards activity tolerance and improved function Baseline: see ROM chart Goal status: INITIAL     LONG TERM GOALS: Target date: 07/27/24   Pt  will score no less than 65/80 on LEFS to demonstrate improved activity tolerance Baseline:  Goal status: INITIAL   2.  Pt will report no greater than 2/10 pain over 7 consecutive days to demonstrate maintained reduction in symptoms and improved tolerance to activity Baseline: 0/10-9/10 Goal status: INITIAL   3.  Pt will be ind in the management of their symptoms at home and in the community Baseline:  Goal status: INITIAL   4.  Pt will report being able to climb a flight of stairs without being limited by L knee pain Baseline:  Goal status: INITIAL      PLAN:  PT FREQUENCY: 1-2x/week  PT DURATION: 8 weeks  PLANNED INTERVENTIONS: 97110-Therapeutic exercises, 97530- Therapeutic activity, [id number]- Neuromuscular re-education, 97535- Self Care, [id number]- Manual therapy, [id number]- Gait training, [phone]- Electrical stimulation (unattended), [id number]- Vasopneumatic device, 20560 (1-2 muscles), 20561 (3+ muscles)- Dry Needling, Patient/Family education, Cryotherapy, and Moist heat  PLAN FOR NEXT SESSION: progress L knee ROM, strength, stability through functional movement patterns, update HEP as indicated   Shanda Code, SPTA 06/18/2024, 8:00 AM

## 2024-06-21 ENCOUNTER — Ambulatory Visit: Payer: MEDICAID

## 2024-06-21 ENCOUNTER — Telehealth: Payer: Self-pay

## 2024-06-21 NOTE — Telephone Encounter (Signed)
 Spoke to patient regarding missed appointment. She stated that she had called to cancel this week's appointments, does not know who she spoke to.   Only schedule 1 at a time moving forward.  2nd no-show  Teresa Baldwin, [name] 06/21/2024 10:00 AM

## 2024-06-21 NOTE — Therapy (Incomplete)
 OUTPATIENT PHYSICAL THERAPY TREATMENT NOTE   Patient Name: Teresa Baldwin MRN: [id number] DOB:[birth year], 23 y.o., female Today's Date: 06/21/2024  END OF SESSION:    Past Medical History:  Diagnosis Date   Anxiety    Depression    Medical history non-contributory    Traumatic brain injury [facility])    Past Surgical History:  Procedure Laterality Date   CESAREAN SECTION N/A 12/18/2017   Procedure: CESAREAN SECTION;  Surgeon: Eveline Lynwood MATSU, MD;  Location: [facility] BIRTHING SUITES;  Service: Obstetrics;  Laterality: N/A;   NO PAST SURGERIES     Patient Active Problem List   Diagnosis Date Noted   Encounter for hepatitis C screening test for low risk patient 03/22/2024   Need for influenza vaccination 03/22/2024   Screening for cardiovascular condition 03/22/2024   Encounter for general adult medical examination w/o abnormal findings 03/22/2024   Moderate episode of recurrent major depressive disorder (HCC) 09/16/2023   Adjustment disorder with mixed emotional features 09/05/2023   Intellectual developmental disorder, mild 08/11/2023   Vitamin D  deficiency 08/05/2023   Nasal sinus congestion 08/05/2023   Anxiety and depression 08/05/2023   Dry cough 08/05/2023   Establishing care with new doctor, encounter for 08/05/2023   Status post primary low transverse cesarean section 12/18/2017   Uterine fibroid 12/18/2017   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  Rationale for Evaluation and Treatment: Rehabilitation  ONSET DATE: 01/24/24  SUBJECTIVE:   SUBJECTIVE STATEMENT: ***  Patient states that the pain isn't too bad today, has been compliant with HEP.  EVAL: Pt fell in July and slipped and hit her knee. Pt noted immediate pain in her L  knee. Pt had MRI (see diagnostic imaging for details). Pt notes increased symptoms when negotiating stairs up and down. Symptoms range in intensity from 0/10-9/10 depending on activity or position. Symptoms are independent of time of day. Pt describes symptoms as sharp.   PERTINENT HISTORY: Left knee pain status post fall 01/24/2024 with MRI showing irregularity of chondral surface of the patella and concern for possible prior patellar dislocation.  Also mild pes bursitis and fat pad inflammation.  No instability on exam today - Non-surgical, conservative management indicated. - Recommend physical therapy to strengthen thigh muscles and alleviate knee pain. Referral to [facility] on [address] given today - Fitted with a patellar stabilization knee brace today for support. - Recommended restarting Meloxicam  15mg  daily prn for inflammation management. - Follow up in four to six weeks to assess progress. PAIN:  Are you having pain? Yes: NPRS scale: 0/10 Pain location: L knee Pain description: sharp Aggravating factors: movement, stairs Relieving factors: rest  PRECAUTIONS: None  RED FLAGS: None   WEIGHT BEARING RESTRICTIONS: No  FALLS:  Has patient fallen in last 6 months? Yes. Number of falls 1  LIVING ENVIRONMENT: Lives with: lives with their son Lives in: House/apartment Stairs: Yes: External: flight steps; can reach both Has following equipment at home: Walker - 4 wheeled-fathers  OCCUPATION: not working, caretaker  PLOF: Independent  PATIENT GOALS: no pain, back to work  NEXT MD VISIT: July 31, 2024-podiatry  OBJECTIVE:  Note: Objective measures were completed at Evaluation unless otherwise noted.  DIAGNOSTIC FINDINGS: IMPRESSION: 1. Intact ligamentous structures and no acute bony findings. 2. No meniscal tears.  3. Mild cartilaginous irregularity along the medial most aspect of the medial facet of the patella suggesting prior injury. 4. Moderate  thickening and inflammation/edema along the medial retinacular attachment site on the patella. There is also mild thickening and inflammation/edema in the upper medial aspect of Hoffa's fat. Findings could suggest a prior patellar dislocation injury, retinaculum sprain or partial tear. 5. MCL and pes anserine bursitis. 6. No joint effusion or Baker's cyst.  PATIENT SURVEYS:  LEFS  Extreme difficulty/unable (0), Quite a bit of difficulty (1), Moderate difficulty (2), Little difficulty (3), No difficulty (4) Survey date:  06/01/24  Any of your usual work, housework or school activities 2  2. Usual hobbies, recreational or sporting activities 0  3. Getting into/out of the bath 4  4. Walking between rooms 2  5. Putting on socks/shoes 2  6. Squatting  0  7. Lifting an object, like a bag of groceries from the floor 2  8. Performing light activities around your home 3  9. Performing heavy activities around your home 1  10. Getting into/out of a car 2  11. Walking 2 blocks 2  12. Walking 1 mile 2  13. Going up/down 10 stairs (1 flight) 4  14. Standing for 1 hour 3  15.  sitting for 1 hour 3  16. Running on even ground 2  17. Running on uneven ground 2  18. Making sharp turns while running fast 1  19. Hopping  4  20. Rolling over in bed 4  Score total:  45/80     COGNITION: Overall cognitive status: Within functional limits for tasks assessed     SENSATION: In sensitivity in L thigh with light touch reproducing L knee pain    POSTURE: rounded shoulders and forward head  PALPATION: Pt has decreased tolerance to palpation, patella appears to be floating with edema superficial and deep. Inc TTP in the L thigh globally  LOWER EXTREMITY ROM: Bil hip ROM WNL  Active ROM Right eval Left eval  Hip flexion    Hip extension    Hip abduction    Hip adduction    Hip internal rotation    Hip external rotation    Knee flexion WFL 111  Knee extension WFL 5  Ankle dorsiflexion     Ankle plantarflexion    Ankle inversion    Ankle eversion     (Blank rows = not tested)  LOWER EXTREMITY MMT:  MMT Right eval Left eval  Hip flexion 4+/5 3+/5  Hip extension 4+/5 4/5  Hip abduction 4/5 3/5  Hip adduction 5/5 4/5  Hip internal rotation    Hip external rotation    Knee flexion 4+/5 4/5  Knee extension 4+/5 3+/5  Ankle dorsiflexion    Ankle plantarflexion    Ankle inversion    Ankle eversion     (Blank rows = not tested)  LOWER EXTREMITY SPECIAL TESTS:  Knee special tests: deferred  FUNCTIONAL TESTS:  deferred  GAIT: Distance walked: lobby to treatment area Assistive device utilized: None Level of assistance: Complete Independence Comments: Pt has slight delay before initiating steps and first steps show antalgic apprehension with LLE stance phase. Improves as steps progress but this pattern remains.  TREATMENT DATE: [facility] Adult PT Treatment:                                                DATE: 06/21/24 Therapeutic Exercise: Nustep level 2 x 5 mins Seated LAQ LLE 2x10 #2 Seated hip adduction ball squeeze 5 hold 2x10 Supine heel slide 5 hold 2x15 Supine bridges 2x10 Seated hip fallouts RTB 2x10 BIL Seated hamstring curl LLE RTB 2x10 Neuromuscular re-ed: TKE against ball on wall 5 hold 2x10 LLE Supine quad set 5 hold 2x10 LLE Supine SLR with quad set x10 LLE SAQ LLE 2x10 2# Therapeutic Activity: STS with LLE back to encourage weight shift onto LLE 2x10  [facility] Adult PT Treatment:                                                DATE: 06/14/24 Therapeutic Exercise: Nustep level 2 x 5 mins while gathering subjective, working on knee flexion ROM Seated LAQ LLE 2x10 Seated hip adduction ball squeeze 5 hold 2x10 Supine heel slide 5 hold 2x15 Supine bridges 2x10 SL clamshells 2x10 Seated hamstring curl LLE RTB  2x10 Neuromuscular re-ed: TKE against ball on wall 5 hold 2x10 LLE Supine quad set 5 hold 2x10 LLE Supine SLR with quad set x10 LLE SAQ LLE 2x10 2# Therapeutic Activity: STS with LLE back to encourage weight shift onto LLE 2x10   [facility] Adult PT Treatment:                                                DATE: 06/01/24 Therapeutic Exercise: Quad set x10 in supine Heel slide x10 in supine Pt edu HEP developed and reviewed   PATIENT EDUCATION:  Education details: Pt educated on relevant anatomy, physiology, pathology, diagnosis, prognosis, progression of care, pain and activity modification related to L knee pain.  Person educated: Patient Education method: Explanation, Demonstration, and Handouts Education comprehension: verbalized understanding and returned demonstration  HOME EXERCISE PROGRAM: Access Code: [facility] URL: [email or web address] Date: 06/01/2024 Prepared by: Stann Ohara  Exercises - Seated Quad Set  - 3 x daily - 7 x weekly - 1 sets - 10 reps - 2 hold - Supine Heel Slide  - 1 x daily - 7 x weekly - 3 sets - 10 reps - 2 hold - Active Straight Leg Raise with Quad Set  - 1 x daily - 7 x weekly - 1 sets - 10 reps - 2 hold - Side to Side Weight Shift with Counter Support  - 1 x daily - 7 x weekly - 1 sets - 45 reps - 2 hold  ASSESSMENT:  CLINICAL IMPRESSION: ***  Patient presents to PT reporting that she isn't having as much pain as she was before. Reports compliant with HEP. Today's session focused on quad activation and LE strengthening. Patient tolerated exercises well, reports some muscular fatigue with exercises. Patient required verbal and tactile cues for correct technique of exercises. Patient will benefit from skilled PT in order to increase functional independence.   EVAL: Patient is a 23 y.o. F who was seen today for physical therapy evaluation  and treatment for L knee pain. Symptoms are consistent with L knee dysfunction following a fall  onto the knee in July of 2025. Pt has significant limitations in function due to pain. Pt dons support brace most of the time, and feels it helps to provide stability. Sensory and motor deficits present in the knee capsule and thigh, begin to normalize distal to the knee. Symptoms are intermittent in nature and response to provided tasks today consistent with tissue remodeling principles and gradual progression of activity. Pt stands to benefit from continued skilled physical therapy to address deficit areas and restore safety with activities and participations at home and in the community.    OBJECTIVE IMPAIRMENTS: Abnormal gait, decreased activity tolerance, decreased endurance, decreased mobility, difficulty walking, decreased ROM, decreased strength, increased fascial restrictions, impaired perceived functional ability, impaired sensation, and pain.   ACTIVITY LIMITATIONS: carrying, lifting, bending, standing, squatting, stairs, and caring for others  PARTICIPATION LIMITATIONS: cleaning, laundry, community activity, occupation, and yard work  PERSONAL FACTORS: Profession, Time since onset of injury/illness/exacerbation, and 1 comorbidity: Foot and ankle pain are also affecting patient's functional outcome.   REHAB POTENTIAL: Good  CLINICAL DECISION MAKING: Stable/uncomplicated  EVALUATION COMPLEXITY: Low   GOALS: Goals reviewed with patient? Yes  SHORT TERM GOALS: Target date: 07/01/24   Pt will report compliance with HEP to work towards ind and home management strategies Baseline: Goal status: INITIAL   2.  Pt will score no less than 55/80 on LEFS to demonstrate improved activity tolerance Baseline:  Goal status: INITIAL   3.  Pt will improve L knee ROM to at least 120 degrees knee flexion and painless in order to demonstrate progress towards activity tolerance and improved function Baseline: see ROM chart Goal status: INITIAL     LONG TERM GOALS: Target date: 07/27/24   Pt  will score no less than 65/80 on LEFS to demonstrate improved activity tolerance Baseline:  Goal status: INITIAL   2.  Pt will report no greater than 2/10 pain over 7 consecutive days to demonstrate maintained reduction in symptoms and improved tolerance to activity Baseline: 0/10-9/10 Goal status: INITIAL   3.  Pt will be ind in the management of their symptoms at home and in the community Baseline:  Goal status: INITIAL   4.  Pt will report being able to climb a flight of stairs without being limited by L knee pain Baseline:  Goal status: INITIAL    PLAN:  PT FREQUENCY: 1-2x/week  PT DURATION: 8 weeks  PLANNED INTERVENTIONS: 97110-Therapeutic exercises, 97530- Therapeutic activity, [id number]- Neuromuscular re-education, 97535- Self Care, [id number]- Manual therapy, [phone]- Gait training, [phone]- Electrical stimulation (unattended), [id number]- Vasopneumatic device, 20560 (1-2 muscles), 20561 (3+ muscles)- Dry Needling, Patient/Family education, Cryotherapy, and Moist heat  PLAN FOR NEXT SESSION: progress L knee ROM, strength, stability through functional movement patterns, update HEP as indicated   Corean Pouch, PTA 06/21/2024, 8:06 AM

## 2024-06-25 NOTE — Therapy (Deleted)
 OUTPATIENT PHYSICAL THERAPY TREATMENT NOTE   Patient Name: Teresa Baldwin MRN: [id number] DOB:[birth year], 23 y.o., female Today's Date: 06/25/2024  END OF SESSION:    Past Medical History:  Diagnosis Date   Anxiety    Depression    Medical history non-contributory    Traumatic brain injury [facility])    Past Surgical History:  Procedure Laterality Date   CESAREAN SECTION N/A 12/18/2017   Procedure: CESAREAN SECTION;  Surgeon: Eveline Lynwood MATSU, MD;  Location: [facility] BIRTHING SUITES;  Service: Obstetrics;  Laterality: N/A;   NO PAST SURGERIES     Patient Active Problem List   Diagnosis Date Noted   Encounter for hepatitis C screening test for low risk patient 03/22/2024   Need for influenza vaccination 03/22/2024   Screening for cardiovascular condition 03/22/2024   Encounter for general adult medical examination w/o abnormal findings 03/22/2024   Moderate episode of recurrent major depressive disorder (HCC) 09/16/2023   Adjustment disorder with mixed emotional features 09/05/2023   Intellectual developmental disorder, mild 08/11/2023   Vitamin D  deficiency 08/05/2023   Nasal sinus congestion 08/05/2023   Anxiety and depression 08/05/2023   Dry cough 08/05/2023   Establishing care with new doctor, encounter for 08/05/2023   Status post primary low transverse cesarean section 12/18/2017   Uterine fibroid 12/18/2017   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  Rationale for Evaluation and Treatment: Rehabilitation  ONSET DATE: 01/24/24  SUBJECTIVE:   SUBJECTIVE STATEMENT: ***  Patient states that the pain isn't too bad today, has been compliant with HEP.  EVAL: Pt fell in July and slipped and hit her knee. Pt noted immediate pain in her L  knee. Pt had MRI (see diagnostic imaging for details). Pt notes increased symptoms when negotiating stairs up and down. Symptoms range in intensity from 0/10-9/10 depending on activity or position. Symptoms are independent of time of day. Pt describes symptoms as sharp.   PERTINENT HISTORY: Left knee pain status post fall 01/24/2024 with MRI showing irregularity of chondral surface of the patella and concern for possible prior patellar dislocation.  Also mild pes bursitis and fat pad inflammation.  No instability on exam today - Non-surgical, conservative management indicated. - Recommend physical therapy to strengthen thigh muscles and alleviate knee pain. Referral to [facility] Physical Therapy on [address] given today - Fitted with a patellar stabilization knee brace today for support. - Recommended restarting Meloxicam  15mg  daily prn for inflammation management. - Follow up in four to six weeks to assess progress. PAIN:  Are you having pain? Yes: NPRS scale: 0/10 Pain location: L knee Pain description: sharp Aggravating factors: movement, stairs Relieving factors: rest  PRECAUTIONS: None  RED FLAGS: None   WEIGHT BEARING RESTRICTIONS: No  FALLS:  Has patient fallen in last 6 months? Yes. Number of falls 1  LIVING ENVIRONMENT: Lives with: lives with their son Lives in: House/apartment Stairs: Yes: External: flight steps; can reach both Has following equipment at home: Walker - 4 wheeled-fathers  OCCUPATION: not working, caretaker  PLOF: Independent  PATIENT GOALS: no pain, back to work  NEXT MD VISIT: July 31, 2024-podiatry  OBJECTIVE:  Note: Objective measures were completed at Evaluation unless otherwise noted.  DIAGNOSTIC FINDINGS: IMPRESSION: 1. Intact ligamentous structures and no acute bony findings. 2. No meniscal tears.  3. Mild cartilaginous irregularity along the medial most aspect of the medial facet of the patella suggesting prior injury. 4. Moderate  thickening and inflammation/edema along the medial retinacular attachment site on the patella. There is also mild thickening and inflammation/edema in the upper medial aspect of Hoffa's fat. Findings could suggest a prior patellar dislocation injury, retinaculum sprain or partial tear. 5. MCL and pes anserine bursitis. 6. No joint effusion or Baker's cyst.  PATIENT SURVEYS:  LEFS  Extreme difficulty/unable (0), Quite a bit of difficulty (1), Moderate difficulty (2), Little difficulty (3), No difficulty (4) Survey date:  06/01/24  Any of your usual work, housework or school activities 2  2. Usual hobbies, recreational or sporting activities 0  3. Getting into/out of the bath 4  4. Walking between rooms 2  5. Putting on socks/shoes 2  6. Squatting  0  7. Lifting an object, like a bag of groceries from the floor 2  8. Performing light activities around your home 3  9. Performing heavy activities around your home 1  10. Getting into/out of a car 2  11. Walking 2 blocks 2  12. Walking 1 mile 2  13. Going up/down 10 stairs (1 flight) 4  14. Standing for 1 hour 3  15.  sitting for 1 hour 3  16. Running on even ground 2  17. Running on uneven ground 2  18. Making sharp turns while running fast 1  19. Hopping  4  20. Rolling over in bed 4  Score total:  45/80     COGNITION: Overall cognitive status: Within functional limits for tasks assessed     SENSATION: In sensitivity in L thigh with light touch reproducing L knee pain    POSTURE: rounded shoulders and forward head  PALPATION: Pt has decreased tolerance to palpation, patella appears to be floating with edema superficial and deep. Inc TTP in the L thigh globally  LOWER EXTREMITY ROM: Bil hip ROM WNL  Active ROM Right eval Left eval  Hip flexion    Hip extension    Hip abduction    Hip adduction    Hip internal rotation    Hip external rotation    Knee flexion WFL 111  Knee extension WFL 5  Ankle dorsiflexion     Ankle plantarflexion    Ankle inversion    Ankle eversion     (Blank rows = not tested)  LOWER EXTREMITY MMT:  MMT Right eval Left eval  Hip flexion 4+/5 3+/5  Hip extension 4+/5 4/5  Hip abduction 4/5 3/5  Hip adduction 5/5 4/5  Hip internal rotation    Hip external rotation    Knee flexion 4+/5 4/5  Knee extension 4+/5 3+/5  Ankle dorsiflexion    Ankle plantarflexion    Ankle inversion    Ankle eversion     (Blank rows = not tested)  LOWER EXTREMITY SPECIAL TESTS:  Knee special tests: deferred  FUNCTIONAL TESTS:  deferred  GAIT: Distance walked: lobby to treatment area Assistive device utilized: None Level of assistance: Complete Independence Comments: Pt has slight delay before initiating steps and first steps show antalgic apprehension with LLE stance phase. Improves as steps progress but this pattern remains.  TREATMENT DATE: [facility] Adult PT Treatment:                                                DATE: 06/21/24 Therapeutic Exercise: Nustep level 2 x 5 mins Seated LAQ LLE 2x10 #2 Seated hip adduction ball squeeze 5 hold 2x10 Supine heel slide 5 hold 2x15 Supine bridges 2x10 Seated hip fallouts RTB 2x10 BIL Seated hamstring curl LLE RTB 2x10 Neuromuscular re-ed: TKE against ball on wall 5 hold 2x10 LLE Supine quad set 5 hold 2x10 LLE Supine SLR with quad set x10 LLE SAQ LLE 2x10 2# Therapeutic Activity: STS with LLE back to encourage weight shift onto LLE 2x10  [facility] Adult PT Treatment:                                                DATE: 06/14/24 Therapeutic Exercise: Nustep level 2 x 5 mins while gathering subjective, working on knee flexion ROM Seated LAQ LLE 2x10 Seated hip adduction ball squeeze 5 hold 2x10 Supine heel slide 5 hold 2x15 Supine bridges 2x10 SL clamshells 2x10 Seated hamstring curl LLE RTB  2x10 Neuromuscular re-ed: TKE against ball on wall 5 hold 2x10 LLE Supine quad set 5 hold 2x10 LLE Supine SLR with quad set x10 LLE SAQ LLE 2x10 2# Therapeutic Activity: STS with LLE back to encourage weight shift onto LLE 2x10   [facility] Adult PT Treatment:                                                DATE: 06/01/24 Therapeutic Exercise: Quad set x10 in supine Heel slide x10 in supine Pt edu HEP developed and reviewed   PATIENT EDUCATION:  Education details: Pt educated on relevant anatomy, physiology, pathology, diagnosis, prognosis, progression of care, pain and activity modification related to L knee pain.  Person educated: Patient Education method: Explanation, Demonstration, and Handouts Education comprehension: verbalized understanding and returned demonstration  HOME EXERCISE PROGRAM: Access Code: [facility] URL: [email or web address] Date: 06/01/2024 Prepared by: Stann Ohara  Exercises - Seated Quad Set  - 3 x daily - 7 x weekly - 1 sets - 10 reps - 2 hold - Supine Heel Slide  - 1 x daily - 7 x weekly - 3 sets - 10 reps - 2 hold - Active Straight Leg Raise with Quad Set  - 1 x daily - 7 x weekly - 1 sets - 10 reps - 2 hold - Side to Side Weight Shift with Counter Support  - 1 x daily - 7 x weekly - 1 sets - 45 reps - 2 hold  ASSESSMENT:  CLINICAL IMPRESSION: ***  Patient presents to PT reporting that she isn't having as much pain as she was before. Reports compliant with HEP. Today's session focused on quad activation and LE strengthening. Patient tolerated exercises well, reports some muscular fatigue with exercises. Patient required verbal and tactile cues for correct technique of exercises. Patient will benefit from skilled PT in order to increase functional independence.   EVAL: Patient is a 23 y.o. F who was seen today for physical therapy evaluation  and treatment for L knee pain. Symptoms are consistent with L knee dysfunction following a fall  onto the knee in July of 2025. Pt has significant limitations in function due to pain. Pt dons support brace most of the time, and feels it helps to provide stability. Sensory and motor deficits present in the knee capsule and thigh, begin to normalize distal to the knee. Symptoms are intermittent in nature and response to provided tasks today consistent with tissue remodeling principles and gradual progression of activity. Pt stands to benefit from continued skilled physical therapy to address deficit areas and restore safety with activities and participations at home and in the community.    OBJECTIVE IMPAIRMENTS: Abnormal gait, decreased activity tolerance, decreased endurance, decreased mobility, difficulty walking, decreased ROM, decreased strength, increased fascial restrictions, impaired perceived functional ability, impaired sensation, and pain.   ACTIVITY LIMITATIONS: carrying, lifting, bending, standing, squatting, stairs, and caring for others  PARTICIPATION LIMITATIONS: cleaning, laundry, community activity, occupation, and yard work  PERSONAL FACTORS: Profession, Time since onset of injury/illness/exacerbation, and 1 comorbidity: Foot and ankle pain are also affecting patient's functional outcome.   REHAB POTENTIAL: Good  CLINICAL DECISION MAKING: Stable/uncomplicated  EVALUATION COMPLEXITY: Low   GOALS: Goals reviewed with patient? Yes  SHORT TERM GOALS: Target date: 07/01/24   Pt will report compliance with HEP to work towards ind and home management strategies Baseline: Goal status: INITIAL   2.  Pt will score no less than 55/80 on LEFS to demonstrate improved activity tolerance Baseline:  Goal status: INITIAL   3.  Pt will improve L knee ROM to at least 120 degrees knee flexion and painless in order to demonstrate progress towards activity tolerance and improved function Baseline: see ROM chart Goal status: INITIAL     LONG TERM GOALS: Target date: 07/27/24   Pt  will score no less than 65/80 on LEFS to demonstrate improved activity tolerance Baseline:  Goal status: INITIAL   2.  Pt will report no greater than 2/10 pain over 7 consecutive days to demonstrate maintained reduction in symptoms and improved tolerance to activity Baseline: 0/10-9/10 Goal status: INITIAL   3.  Pt will be ind in the management of their symptoms at home and in the community Baseline:  Goal status: INITIAL   4.  Pt will report being able to climb a flight of stairs without being limited by L knee pain Baseline:  Goal status: INITIAL    PLAN:  PT FREQUENCY: 1-2x/week  PT DURATION: 8 weeks  PLANNED INTERVENTIONS: 97110-Therapeutic exercises, 97530- Therapeutic activity, [id number]- Neuromuscular re-education, 97535- Self Care, [id number]- Manual therapy, [id number]- Gait training, [id number]- Electrical stimulation (unattended), [id number]- Vasopneumatic device, 20560 (1-2 muscles), 20561 (3+ muscles)- Dry Needling, Patient/Family education, Cryotherapy, and Moist heat  PLAN FOR NEXT SESSION: progress L knee ROM, strength, stability through functional movement patterns, update HEP as indicated   Reyes CHRISTELLA Kohut, PT 06/25/2024, 1:22 PM

## 2024-06-27 ENCOUNTER — Ambulatory Visit: Payer: MEDICAID

## 2024-06-29 ENCOUNTER — Ambulatory Visit: Payer: MEDICAID

## 2024-07-16 ENCOUNTER — Ambulatory Visit: Payer: MEDICAID

## 2024-07-20 NOTE — Therapy (Addendum)
 " OUTPATIENT PHYSICAL THERAPY TREATMENT NOTE/PROGRESS NOTE/DISCHARGE   Patient Name: Teresa Baldwin MRN: [id number] DOB:Sep 22, 2000, 24 y.o., female Today's Date: 07/23/2024 PHYSICAL THERAPY DISCHARGE SUMMARY  Visits from Start of Care: 3  Current functional level related to goals / functional outcomes: See note   Remaining deficits: See note   Education / Equipment: HEP   Patient agrees to discharge. Patient goals were partially met. Patient is being discharged due to not returning since the last visit.  Patient discharged from OPPT per attendance policy.   END OF SESSION:  PT End of Session - 07/23/24 0941     Visit Number 3    Number of Visits 13    Date for Recertification  09/20/24    Authorization Type [employer]    Authorization Time Period approved 16 PT visits from 06/14/24-07/27/24    Authorization - Visit Number 3    Authorization - Number of Visits 16    PT Start Time 0945    PT Stop Time 1030    PT Time Calculation (min) 45 min    Activity Tolerance Patient tolerated treatment well    Behavior During Therapy WFL for tasks assessed/performed           Past Medical History:  Diagnosis Date   Anxiety    Depression    Medical history non-contributory    Traumatic brain injury [facility])    Past Surgical History:  Procedure Laterality Date   CESAREAN SECTION N/A 12/18/2017   Procedure: CESAREAN SECTION;  Surgeon: Eveline Lynwood MATSU, MD;  Location: [facility] BIRTHING SUITES;  Service: Obstetrics;  Laterality: N/A;   NO PAST SURGERIES     Patient Active Problem List   Diagnosis Date Noted   Encounter for hepatitis C screening test for low risk patient 03/22/2024   Need for influenza vaccination 03/22/2024   Screening for cardiovascular condition 03/22/2024   Encounter for general adult medical examination w/o abnormal findings 03/22/2024   Moderate episode of recurrent major depressive disorder (HCC) 09/16/2023   Adjustment disorder with mixed emotional  features 09/05/2023   Intellectual developmental disorder, mild 08/11/2023   Vitamin D  deficiency 08/05/2023   Nasal sinus congestion 08/05/2023   Anxiety and depression 08/05/2023   Dry cough 08/05/2023   Establishing care with new doctor, encounter for 08/05/2023   Status post primary low transverse cesarean section 12/18/2017   Uterine fibroid 12/18/2017   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  Exacerbated L knee pain yesterday as she sustained a misstep.  Pain levels 7/10 yesterday but just a little today.  Pain onsets as she goes to bed at night.  EVAL: Pt fell in July and slipped and hit her knee. Pt noted immediate pain in her L knee. Pt had MRI (see diagnostic imaging for details). Pt notes increased symptoms when negotiating stairs up and down. Symptoms range in intensity from 0/10-9/10 depending on activity or position. Symptoms are independent of time of day. Pt describes symptoms as sharp.   PERTINENT HISTORY: Left knee pain status post fall 01/24/2024 with MRI  showing irregularity of chondral surface of the patella and concern for possible prior patellar dislocation.  Also mild pes bursitis and fat pad inflammation.  No instability on exam today - Non-surgical, conservative management indicated. - Recommend physical therapy to strengthen thigh muscles and alleviate knee pain. Referral to [facility] on [address] given today - Fitted with a patellar stabilization knee brace  today for support. - Recommended restarting Meloxicam  15mg  daily prn for inflammation management. - Follow up in four to six weeks to assess progress. PAIN:  Are you having pain? Yes: NPRS scale: 0/10 Pain location: L knee Pain description: sharp Aggravating factors: movement, stairs Relieving factors: rest  PRECAUTIONS: None  RED FLAGS: None   WEIGHT BEARING RESTRICTIONS: No  FALLS:  Has patient fallen in last 6 months? Yes. Number of falls 1  LIVING ENVIRONMENT: Lives with: lives with their son Lives in: House/apartment Stairs: Yes: External: flight steps; can reach both Has following equipment at home: Walker - 4 wheeled-fathers  OCCUPATION: not working, caretaker  PLOF: Independent  PATIENT GOALS: no pain, back to work  NEXT MD VISIT: July 31, 2024-podiatry  OBJECTIVE:  Note: Objective measures were completed at Evaluation unless otherwise noted.  DIAGNOSTIC FINDINGS: IMPRESSION: 1. Intact ligamentous structures and no acute bony findings. 2. No meniscal tears. 3. Mild cartilaginous irregularity along the medial most aspect of the medial facet of the patella suggesting prior injury. 4. Moderate thickening and inflammation/edema along the medial retinacular attachment site on the patella. There is also mild thickening and inflammation/edema in the upper medial aspect of Hoffa's fat. Findings could suggest a prior patellar dislocation injury, retinaculum sprain or partial tear. 5. MCL and pes anserine bursitis. 6. No joint effusion or Baker's cyst.  PATIENT SURVEYS:  LEFS  Extreme difficulty/unable (0), Quite a bit of difficulty (1), Moderate difficulty (2), Little difficulty (3), No difficulty (4) Survey date:  06/01/24  Any of your usual work, housework or school activities 2  2. Usual hobbies, recreational or sporting activities 0  3. Getting into/out of the bath 4  4. Walking between rooms 2  5. Putting on socks/shoes 2  6. Squatting  0  7. Lifting  an object, like a bag of groceries from the floor 2  8. Performing light activities around your home 3  9. Performing heavy activities around your home 1  10. Getting into/out of a car 2  11. Walking 2 blocks 2  12. Walking 1 mile 2  13. Going up/down 10 stairs (1 flight) 4  14. Standing for 1 hour 3  15.  sitting for 1 hour 3  16. Running on even ground 2  17. Running on uneven ground 2  18. Making sharp turns while running fast 1  19. Hopping  4  20. Rolling over in bed 4  Score total:  45/80   07/23/24 45/80  COGNITION: Overall cognitive status: Within functional limits for tasks assessed     SENSATION: In sensitivity in L thigh with light touch reproducing L knee pain    POSTURE: rounded shoulders and forward head  PALPATION: Pt has decreased tolerance to palpation, patella appears to be floating with edema superficial and deep. Inc TTP in the L thigh globally  LOWER EXTREMITY ROM: Bil hip ROM WNL  Active ROM Right eval Left eval 07/23/24  Hip flexion     Hip extension     Hip abduction     Hip adduction     Hip internal rotation  Hip external rotation     Knee flexion WFL 111 132d  Knee extension WFL 5 0d  Ankle dorsiflexion     Ankle plantarflexion     Ankle inversion     Ankle eversion      (Blank rows = not tested)  LOWER EXTREMITY MMT:  MMT Right eval Left eval  Hip flexion 4+/5 3+/5  Hip extension 4+/5 4/5  Hip abduction 4/5 3/5  Hip adduction 5/5 4/5  Hip internal rotation    Hip external rotation    Knee flexion 4+/5 4/5  Knee extension 4+/5 3+/5  Ankle dorsiflexion    Ankle plantarflexion    Ankle inversion    Ankle eversion     (Blank rows = not tested)  LOWER EXTREMITY SPECIAL TESTS:  Knee special tests: Patellafemoral apprehension test: negative, Lateral pull sign: positive , and Patellafemoral grind test: negative Apprehension as well substitution evident in hip extensors  FUNCTIONAL TESTS:  deferred  GAIT: Distance walked:  lobby to treatment area Assistive device utilized: None Level of assistance: Complete Independence Comments: Pt has slight delay before initiating steps and first steps show antalgic apprehension with LLE stance phase. Improves as steps progress but this pattern remains.                                                                                                                                 TREATMENT DATE: [facility] Adult PT Treatment:                                                DATE: 07/23/24 Therapeutic Exercise: Nustep L4 8 min Therapeutic Activity: Assessment of goal progress, ROM and LEFS re-take. FAQs with adduction 15x SAQs 5# 15x Supine hip fallouts GTB 15x B, 15/15 R S/L clams GTB 15x Heel raise from 4 in step  [facility] Adult PT Treatment:                                                DATE: 06/21/24 Therapeutic Exercise: Nustep level 2 x 5 mins Seated LAQ LLE 2x10 #2 Seated hip adduction ball squeeze 5 hold 2x10 Supine heel slide 5 hold 2x15 Supine bridges 2x10 Seated hip fallouts RTB 2x10 BIL Seated hamstring curl LLE RTB 2x10 Neuromuscular re-ed: TKE against ball on wall 5 hold 2x10 LLE Supine quad set 5 hold 2x10 LLE Supine SLR with quad set x10 LLE SAQ LLE 2x10 2# Therapeutic Activity: STS with LLE back to encourage weight shift onto LLE 2x10  [facility] Adult PT Treatment:  DATE: 06/14/24 Therapeutic Exercise: Nustep level 2 x 5 mins while gathering subjective, working on knee flexion ROM Seated LAQ LLE 2x10 Seated hip adduction ball squeeze 5 hold 2x10 Supine heel slide 5 hold 2x15 Supine bridges 2x10 SL clamshells 2x10 Seated hamstring curl LLE RTB 2x10 Neuromuscular re-ed: TKE against ball on wall 5 hold 2x10 LLE Supine quad set 5 hold 2x10 LLE Supine SLR with quad set x10 LLE SAQ LLE 2x10 2# Therapeutic Activity: STS with LLE back to encourage weight shift onto LLE 2x10   [facility] Adult PT Treatment:                                                 DATE: 06/01/24 Therapeutic Exercise: Quad set x10 in supine Heel slide x10 in supine Pt edu HEP developed and reviewed   PATIENT EDUCATION:  Education details: Pt educated on relevant anatomy, physiology, pathology, diagnosis, prognosis, progression of care, pain and activity modification related to L knee pain.  Person educated: Patient Education method: Explanation, Demonstration, and Handouts Education comprehension: verbalized understanding and returned demonstration  HOME EXERCISE PROGRAM: Access Code: [facility] URL: [email or web address] Date: 06/01/2024 Prepared by: Stann Ohara  Exercises - Seated Quad Set  - 3 x daily - 7 x weekly - 1 sets - 10 reps - 2 hold - Supine Heel Slide  - 1 x daily - 7 x weekly - 3 sets - 10 reps - 2 hold - Active Straight Leg Raise with Quad Set  - 1 x daily - 7 x weekly - 1 sets - 10 reps - 2 hold - Side to Side Weight Shift with Counter Support  - 1 x daily - 7 x weekly - 1 sets - 45 reps - 2 hold  ASSESSMENT:  CLINICAL IMPRESSION: Returns for 3rd f/u session since initial assessment.  Focus of visit was progress note addressing progress towards goals and ongoing deficits.  Patient unable to cite distinct aggravating or relieving factors of knee pain.  AROM WNL, poor kinesthetic awareness demonstrated in attempts to perform a quad set, no extensor lag identified.  LEFS score unchanged  EVAL: Patient is a 24 y.o. F who was seen today for physical therapy evaluation and treatment for L knee pain. Symptoms are consistent with L knee dysfunction following a fall onto the knee in July of 2025. Pt has significant limitations in function due to pain. Pt dons support brace most of the time, and feels it helps to provide stability. Sensory and motor deficits present in the knee capsule and thigh, begin to normalize distal to the knee. Symptoms are intermittent in nature and response to provided tasks  today consistent with tissue remodeling principles and gradual progression of activity. Pt stands to benefit from continued skilled physical therapy to address deficit areas and restore safety with activities and participations at home and in the community.    OBJECTIVE IMPAIRMENTS: Abnormal gait, decreased activity tolerance, decreased endurance, decreased mobility, difficulty walking, decreased ROM, decreased strength, increased fascial restrictions, impaired perceived functional ability, impaired sensation, and pain.   ACTIVITY LIMITATIONS: carrying, lifting, bending, standing, squatting, stairs, and caring for others  PARTICIPATION LIMITATIONS: cleaning, laundry, community activity, occupation, and yard work  PERSONAL FACTORS: Profession, Time since onset of injury/illness/exacerbation, and 1 comorbidity: Foot and ankle pain are also affecting patient's functional outcome.   REHAB POTENTIAL: Good  CLINICAL DECISION  MAKING: Stable/uncomplicated  EVALUATION COMPLEXITY: Low   GOALS: Goals reviewed with patient? Yes  SHORT TERM GOALS: Target date: 07/01/24   Pt will report compliance with HEP to work towards ind and home management strategies Baseline: [id number], performing 3-4x/week Goal status: Met   2.  Pt will score no less than 55/80 on LEFS to demonstrate improved activity tolerance Baseline: 07/23/24 45 Goal status: Not met   3.  Pt will improve L knee ROM to at least 120 degrees knee flexion and painless in order to demonstrate progress towards activity tolerance and improved function Baseline: see ROM chart 07/23/24  Active ROM Right eval Left eval 07/23/24  Hip flexion     Hip extension     Hip abduction     Hip adduction     Hip internal rotation     Hip external rotation     Knee flexion WFL 111 132d  Knee extension WFL 5 0d   Goal status: Met     LONG TERM GOALS: Target date: 09/20/24   Pt will score no less than 65/80 on LEFS to demonstrate improved  activity tolerance Baseline: 07/23/24 45/80 Goal status: Unchanged   2.  Pt will report no greater than 2/10 pain over 7 consecutive days to demonstrate maintained reduction in symptoms and improved tolerance to activity Baseline: 0/10-9/10; 07/23/24 0-7/10 Goal status: Ongoing   3.  Pt will be ind in the management of their symptoms at home and in the community Baseline: Patient feels she needs more therapy to return to PLOF Goal status: Ongoing   4.  Pt will report being able to climb a flight of stairs without being limited by L knee pain Baseline:  Goal status: Ongoing    PLAN:  PT FREQUENCY: 1x/week  PT DURATION: 4 weeks  PLANNED INTERVENTIONS: 97110-Therapeutic exercises, 97530- Therapeutic activity, [id number]- Neuromuscular re-education, 97535- Self Care, [id number]- Manual therapy, [phone]- Gait training, [phone]- Electrical stimulation (unattended), [id number]- Vasopneumatic device, 20560 (1-2 muscles), 20561 (3+ muscles)- Dry Needling, Patient/Family education, Cryotherapy, and Moist heat  PLAN FOR NEXT SESSION: progress L knee ROM, strength, stability through functional movement patterns, update HEP as indicated   Reyes CHRISTELLA Kohut, PT 07/23/2024, 10:30 AM  "

## 2024-07-23 ENCOUNTER — Ambulatory Visit: Payer: MEDICAID | Attending: Family Medicine

## 2024-07-23 DIAGNOSIS — R2689 Other abnormalities of gait and mobility: Secondary | ICD-10-CM | POA: Insufficient documentation

## 2024-07-23 DIAGNOSIS — M25662 Stiffness of left knee, not elsewhere classified: Secondary | ICD-10-CM | POA: Insufficient documentation

## 2024-07-23 DIAGNOSIS — M25562 Pain in left knee: Secondary | ICD-10-CM | POA: Insufficient documentation

## 2024-07-23 DIAGNOSIS — G8929 Other chronic pain: Secondary | ICD-10-CM | POA: Diagnosis present

## 2024-07-31 ENCOUNTER — Ambulatory Visit: Payer: MEDICAID | Admitting: Family Medicine

## 2024-07-31 NOTE — Therapy (Unsigned)
 " OUTPATIENT PHYSICAL THERAPY TREATMENT NOTE/PROGRESS NOTE   Patient Name: Teresa Baldwin MRN: [id number] DOB:2000/08/10, 24 y.o., female Today's Date: 07/31/2024  END OF SESSION:     Past Medical History:  Diagnosis Date   Anxiety    Depression    Medical history non-contributory    Traumatic brain injury [facility])    Past Surgical History:  Procedure Laterality Date   CESAREAN SECTION N/A 12/18/2017   Procedure: CESAREAN SECTION;  Surgeon: Teresa Lynwood MATSU, MD;  Location: [facility] BIRTHING SUIT[facility];  Service: Obstetrics;  Laterality: N/A;   NO PAST SURGERIES     Patient Active Problem List   Diagnosis Date Noted   Encounter for hepatitis C screening test for low risk patient 03/22/2024   Need for influenza vaccination 03/22/2024   Screening for cardiovascular condition 03/22/2024   Encounter for general adult medical examination w/o abnormal findings 03/22/2024   Moderate episode of recurrent major depressive disorder (HCC) 09/16/2023   Adjustment disorder with mixed emotional features 09/05/2023   Intellectual developmental disorder, mild 08/11/2023   Vitamin D  deficiency 08/05/2023   Nasal sinus congestion 08/05/2023   Anxiety and depression 08/05/2023   Dry cough 08/05/2023   Establishing care with new doctor, encounter for 08/05/2023   Status post primary low transverse cesarean section 12/18/2017   Uterine fibroid 12/18/2017   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  Rationale for Evaluation and Treatment: Rehabilitation  ONSET DATE: 01/24/24  SUBJECTIVE:   SUBJECTIVE STATEMENT: Returns to PT following 6 week absence.  Exacerbated L knee pain yesterday as she sustained a misstep.  Pain levels 7/10 yesterday but just a little today.   Pain onsets as she goes to bed at night.  EVAL: Pt fell in July and slipped and hit her knee. Pt noted immediate pain in her L knee. Pt had MRI (see diagnostic imaging for details). Pt notes increased symptoms when negotiating stairs up and down. Symptoms range in intensity from 0/10-9/10 depending on activity or position. Symptoms are independent of time of day. Pt describes symptoms as sharp.   PERTINENT HISTORY: Left knee pain status post fall 01/24/2024 with MRI showing irregularity of chondral surface of the patella and concern for possible prior patellar dislocation.  Also mild pes bursitis and fat pad inflammation.  No instability on exam today - Non-surgical, conservative management indicated. - Recommend physical therapy to strengthen thigh muscles and alleviate knee pain. Referral to [facility] on [address] given today - Fitted with a patellar stabilization knee brace today for support. - Recommended restarting Meloxicam  15mg  daily prn for inflammation management. - Follow up in four to six weeks to assess progress. PAIN:  Are you having pain? Yes: NPRS scale: 0/10 Pain location: L knee Pain description: sharp Aggravating factors: movement, stairs Relieving factors: rest  PRECAUTIONS: None  RED FLAGS: None   WEIGHT BEARING RESTRICTIONS: No  FALLS:  Has patient fallen in last 6 months? Yes. Number of falls 1  LIVING ENVIRONMENT: Lives with: lives with their son Lives in: House/apartment Stairs: Yes: External: flight steps; can reach both Has following equipment at home: Walker - 4 wheeled-fathers  OCCUPATION: not working, caretaker  PLOF: Independent  PATIENT GOALS: no pain, back to work  NEXT MD VISIT: July 31, 2024-podiatry  OBJECTIVE:  Note: Objective  measures were completed at Evaluation unless otherwise noted.  DIAGNOSTIC FINDINGS: IMPRESSION: 1. Intact ligamentous structures and no acute bony findings. 2. No meniscal tears. 3. Mild  cartilaginous irregularity along the medial most aspect of the medial facet of the patella suggesting prior injury. 4. Moderate thickening and inflammation/edema along the medial retinacular attachment site on the patella. There is also mild thickening and inflammation/edema in the upper medial aspect of Hoffa's fat. Findings could suggest a prior patellar dislocation injury, retinaculum sprain or partial tear. 5. MCL and pes anserine bursitis. 6. No joint effusion or Baker's cyst.  PATIENT SURVEYS:  LEFS  Extreme difficulty/unable (0), Quite a bit of difficulty (1), Moderate difficulty (2), Little difficulty (3), No difficulty (4) Survey date:  06/01/24  Any of your usual work, housework or school activities 2  2. Usual hobbies, recreational or sporting activities 0  3. Getting into/out of the bath 4  4. Walking between rooms 2  5. Putting on socks/shoes 2  6. Squatting  0  7. Lifting an object, like a bag of groceries from the floor 2  8. Performing light activities around your home 3  9. Performing heavy activities around your home 1  10. Getting into/out of a car 2  11. Walking 2 blocks 2  12. Walking 1 mile 2  13. Going up/down 10 stairs (1 flight) 4  14. Standing for 1 hour 3  15.  sitting for 1 hour 3  16. Running on even ground 2  17. Running on uneven ground 2  18. Making sharp turns while running fast 1  19. Hopping  4  20. Rolling over in bed 4  Score total:  45/80   07/23/24 45/80  COGNITION: Overall cognitive status: Within functional limits for tasks assessed     SENSATION: In sensitivity in L thigh with light touch reproducing L knee pain    POSTURE: rounded shoulders and forward head  PALPATION: Pt has decreased tolerance to palpation, patella appears to be floating with edema superficial and deep. Inc TTP in the L thigh globally  LOWER EXTREMITY ROM: Bil hip ROM WNL  Active ROM Right eval Left eval 07/23/24  Hip flexion     Hip extension     Hip  abduction     Hip adduction     Hip internal rotation     Hip external rotation     Knee flexion WFL 111 132d  Knee extension WFL 5 0d  Ankle dorsiflexion     Ankle plantarflexion     Ankle inversion     Ankle eversion      (Blank rows = not tested)  LOWER EXTREMITY MMT:  MMT Right eval Left eval  Hip flexion 4+/5 3+/5  Hip extension 4+/5 4/5  Hip abduction 4/5 3/5  Hip adduction 5/5 4/5  Hip internal rotation    Hip external rotation    Knee flexion 4+/5 4/5  Knee extension 4+/5 3+/5  Ankle dorsiflexion    Ankle plantarflexion    Ankle inversion    Ankle eversion     (Blank rows = not tested)  LOWER EXTREMITY SPECIAL TESTS:  Knee special tests: Patellafemoral apprehension test: negative, Lateral pull sign: positive , and Patellafemoral grind test: negative Apprehension as well substitution evident in hip extensors  FUNCTIONAL TESTS:  deferred  GAIT: Distance walked: lobby to treatment area Assistive device utilized: None Level of assistance: Complete Independence Comments: Pt has slight delay before initiating steps and first steps show antalgic apprehension with  LLE stance phase. Improves as steps progress but this pattern remains.                                                                                                                                 TREATMENT DATE: [facility] Adult PT Treatment:                                                DATE: 07/23/24 Therapeutic Exercise: Nustep L4 8 min Therapeutic Activity: Assessment of goal progress, ROM and LEFS re-take. FAQs with adduction 15x SAQs 5# 15x Supine hip fallouts GTB 15x B, 15/15 R S/L clams GTB 15x Heel raise from 4 in step  [name] Adult PT Treatment:                                                DATE: 06/21/24 Therapeutic Exercise: Nustep level 2 x 5 mins Seated LAQ LLE 2x10 #2 Seated hip adduction ball squeeze 5 hold 2x10 Supine heel slide 5 hold 2x15 Supine bridges 2x10 Seated hip  fallouts RTB 2x10 BIL Seated hamstring curl LLE RTB 2x10 Neuromuscular re-ed: TKE against ball on wall 5 hold 2x10 LLE Supine quad set 5 hold 2x10 LLE Supine SLR with quad set x10 LLE SAQ LLE 2x10 2# Therapeutic Activity: STS with LLE back to encourage weight shift onto LLE 2x10  [facility] Adult PT Treatment:                                                DATE: 06/14/24 Therapeutic Exercise: Nustep level 2 x 5 mins while gathering subjective, working on knee flexion ROM Seated LAQ LLE 2x10 Seated hip adduction ball squeeze 5 hold 2x10 Supine heel slide 5 hold 2x15 Supine bridges 2x10 SL clamshells 2x10 Seated hamstring curl LLE RTB 2x10 Neuromuscular re-ed: TKE against ball on wall 5 hold 2x10 LLE Supine quad set 5 hold 2x10 LLE Supine SLR with quad set x10 LLE SAQ LLE 2x10 2# Therapeutic Activity: STS with LLE back to encourage weight shift onto LLE 2x10   [facility] Adult PT Treatment:                                                DATE: 06/01/24 Therapeutic Exercise: Quad set x10 in supine Heel slide x10 in supine Pt edu HEP developed and reviewed   PATIENT EDUCATION:  Education details: Pt educated on relevant  anatomy, physiology, pathology, diagnosis, prognosis, progression of care, pain and activity modification related to L knee pain.  Person educated: Patient Education method: Explanation, Demonstration, and Handouts Education comprehension: verbalized understanding and returned demonstration  HOME EXERCISE PROGRAM: Access Code: [facility] URL: [email or web address] Date: 06/01/2024 Prepared by: Stann Ohara  Exercises - Seated Quad Set  - 3 x daily - 7 x weekly - 1 sets - 10 reps - 2 hold - Supine Heel Slide  - 1 x daily - 7 x weekly - 3 sets - 10 reps - 2 hold - Active Straight Leg Raise with Quad Set  - 1 x daily - 7 x weekly - 1 sets - 10 reps - 2 hold - Side to Side Weight Shift with Counter Support  - 1 x daily - 7 x weekly - 1 sets - 45 reps -  2 hold  ASSESSMENT:  CLINICAL IMPRESSION: Returns for 3rd f/u session since initial assessment.  Focus of visit was progress note addressing progress towards goals and ongoing deficits.  Patient unable to cite distinct aggravating or relieving factors of knee pain.  AROM WNL, poor kinesthetic awareness demonstrated in attempts to perform a quad set, no extensor lag identified.  LEFS score unchanged  EVAL: Patient is a 24 y.o. F who was seen today for physical therapy evaluation and treatment for L knee pain. Symptoms are consistent with L knee dysfunction following a fall onto the knee in July of 2025. Pt has significant limitations in function due to pain. Pt dons support brace most of the time, and feels it helps to provide stability. Sensory and motor deficits present in the knee capsule and thigh, begin to normalize distal to the knee. Symptoms are intermittent in nature and response to provided tasks today consistent with tissue remodeling principles and gradual progression of activity. Pt stands to benefit from continued skilled physical therapy to address deficit areas and restore safety with activities and participations at home and in the community.    OBJECTIVE IMPAIRMENTS: Abnormal gait, decreased activity tolerance, decreased endurance, decreased mobility, difficulty walking, decreased ROM, decreased strength, increased fascial restrictions, impaired perceived functional ability, impaired sensation, and pain.   ACTIVITY LIMITATIONS: carrying, lifting, bending, standing, squatting, stairs, and caring for others  PARTICIPATION LIMITATIONS: cleaning, laundry, community activity, occupation, and yard work  PERSONAL FACTORS: Profession, Time since onset of injury/illness/exacerbation, and 1 comorbidity: Foot and ankle pain are also affecting patient's functional outcome.   REHAB POTENTIAL: Good  CLINICAL DECISION MAKING: Stable/uncomplicated  EVALUATION COMPLEXITY: Low   GOALS: Goals  reviewed with patient? Yes  SHORT TERM GOALS: Target date: 07/01/24   Pt will report compliance with HEP to work towards ind and home management strategies Baseline: [id number], performing 3-4x/week Goal status: Met   2.  Pt will score no less than 55/80 on LEFS to demonstrate improved activity tolerance Baseline: 07/23/24 45 Goal status: Not met   3.  Pt will improve L knee ROM to at least 120 degrees knee flexion and painless in order to demonstrate progress towards activity tolerance and improved function Baseline: see ROM chart 07/23/24  Active ROM Right eval Left eval 07/23/24  Hip flexion     Hip extension     Hip abduction     Hip adduction     Hip internal rotation     Hip external rotation     Knee flexion WFL 111 132d  Knee extension WFL 5 0d   Goal status: Met     LONG TERM GOALS:  Target date: 09/20/24   Pt will score no less than 65/80 on LEFS to demonstrate improved activity tolerance Baseline: 07/23/24 45/80 Goal status: Unchanged   2.  Pt will report no greater than 2/10 pain over 7 consecutive days to demonstrate maintained reduction in symptoms and improved tolerance to activity Baseline: 0/10-9/10; 07/23/24 0-7/10 Goal status: Ongoing   3.  Pt will be ind in the management of their symptoms at home and in the community Baseline: Patient feels she needs more therapy to return to PLOF Goal status: Ongoing   4.  Pt will report being able to climb a flight of stairs without being limited by L knee pain Baseline:  Goal status: Ongoing    PLAN:  PT FREQUENCY: 1x/week  PT DURATION: 4 weeks  PLANNED INTERVENTIONS: 97110-Therapeutic exercises, 97530- Therapeutic activity, [id number]- Neuromuscular re-education, 97535- Self Care, [id number]- Manual therapy, [phone]- Gait training, [phone]- Electrical stimulation (unattended), [id number]- Vasopneumatic device, 20560 (1-2 muscles), [id number] (3+ muscles)- Dry Needling, Patient/Family education, Cryotherapy, and Moist heat  PLAN FOR  NEXT SESSION: progress L knee ROM, strength, stability through functional movement patterns, update HEP as indicated   Reyes CHRISTELLA Kohut, PT 07/31/2024, 1:11 PM  "

## 2024-08-01 NOTE — Therapy (Unsigned)
 " OUTPATIENT PHYSICAL THERAPY TREATMENT NOTE/PROGRESS NOTE   Patient Name: Teresa Baldwin MRN: [id number] DOB:06/15/2001, 24 y.o., female Today's Date: 08/01/2024  END OF SESSION:     Past Medical History:  Diagnosis Date   Anxiety    Depression    Medical history non-contributory    Traumatic brain injury [facility])    Past Surgical History:  Procedure Laterality Date   CESAREAN SECTION N/A 12/18/2017   Procedure: CESAREAN SECTION;  Surgeon: Eveline Lynwood MATSU, MD;  Location: [facility];  Service: Obstetrics;  Laterality: N/A;   NO PAST SURGERIES     Patient Active Problem List   Diagnosis Date Noted   Encounter for hepatitis C screening test for low risk patient 03/22/2024   Need for influenza vaccination 03/22/2024   Screening for cardiovascular condition 03/22/2024   Encounter for general adult medical examination w/o abnormal findings 03/22/2024   Moderate episode of recurrent major depressive disorder (HCC) 09/16/2023   Adjustment disorder with mixed emotional features 09/05/2023   Intellectual developmental disorder, mild 08/11/2023   Vitamin D  deficiency 08/05/2023   Nasal sinus congestion 08/05/2023   Anxiety and depression 08/05/2023   Dry cough 08/05/2023   Establishing care with new doctor, encounter for 08/05/2023   Status post primary low transverse cesarean section 12/18/2017   Uterine fibroid 12/18/2017   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  Rationale for Evaluation and Treatment: Rehabilitation  ONSET DATE: 01/24/24  SUBJECTIVE:   SUBJECTIVE STATEMENT: Returns to PT following 6 week absence.  Exacerbated L knee pain yesterday as she sustained a misstep.  Pain levels 7/10 yesterday but just a little today.   Pain onsets as she goes to bed at night.  EVAL: Pt fell in July and slipped and hit her knee. Pt noted immediate pain in her L knee. Pt had MRI (see diagnostic imaging for details). Pt notes increased symptoms when negotiating stairs up and down. Symptoms range in intensity from 0/10-9/10 depending on activity or position. Symptoms are independent of time of day. Pt describes symptoms as sharp.   PERTINENT HISTORY: Left knee pain status post fall 01/24/2024 with MRI showing irregularity of chondral surface of the patella and concern for possible prior patellar dislocation.  Also mild pes bursitis and fat pad inflammation.  No instability on exam today - Non-surgical, conservative management indicated. - Recommend physical therapy to strengthen thigh muscles and alleviate knee pain. Referral to [facility] on [address] given today - Fitted with a patellar stabilization knee brace today for support. - Recommended restarting Meloxicam  15mg  daily prn for inflammation management. - Follow up in four to six weeks to assess progress. PAIN:  Are you having pain? Yes: NPRS scale: 0/10 Pain location: L knee Pain description: sharp Aggravating factors: movement, stairs Relieving factors: rest  PRECAUTIONS: None  RED FLAGS: None   WEIGHT BEARING RESTRICTIONS: No  FALLS:  Has patient fallen in last 6 months? Yes. Number of falls 1  LIVING ENVIRONMENT: Lives with: lives with their son Lives in: House/apartment Stairs: Yes: External: flight steps; can reach both Has following equipment at home: Walker - 4 wheeled-fathers  OCCUPATION: not working, caretaker  PLOF: Independent  PATIENT GOALS: no pain, back to work  NEXT MD VISIT: July 31, 2024-podiatry  OBJECTIVE:  Note: Objective  measures were completed at Evaluation unless otherwise noted.  DIAGNOSTIC FINDINGS: IMPRESSION: 1. Intact ligamentous structures and no acute bony findings. 2. No meniscal tears. 3. Mild  cartilaginous irregularity along the medial most aspect of the medial facet of the patella suggesting prior injury. 4. Moderate thickening and inflammation/edema along the medial retinacular attachment site on the patella. There is also mild thickening and inflammation/edema in the upper medial aspect of Hoffa's fat. Findings could suggest a prior patellar dislocation injury, retinaculum sprain or partial tear. 5. MCL and pes anserine bursitis. 6. No joint effusion or Baker's cyst.  PATIENT SURVEYS:  LEFS  Extreme difficulty/unable (0), Quite a bit of difficulty (1), Moderate difficulty (2), Little difficulty (3), No difficulty (4) Survey date:  06/01/24  Any of your usual work, housework or school activities 2  2. Usual hobbies, recreational or sporting activities 0  3. Getting into/out of the bath 4  4. Walking between rooms 2  5. Putting on socks/shoes 2  6. Squatting  0  7. Lifting an object, like a bag of groceries from the floor 2  8. Performing light activities around your home 3  9. Performing heavy activities around your home 1  10. Getting into/out of a car 2  11. Walking 2 blocks 2  12. Walking 1 mile 2  13. Going up/down 10 stairs (1 flight) 4  14. Standing for 1 hour 3  15.  sitting for 1 hour 3  16. Running on even ground 2  17. Running on uneven ground 2  18. Making sharp turns while running fast 1  19. Hopping  4  20. Rolling over in bed 4  Score total:  45/80   07/23/24 45/80  COGNITION: Overall cognitive status: Within functional limits for tasks assessed     SENSATION: In sensitivity in L thigh with light touch reproducing L knee pain    POSTURE: rounded shoulders and forward head  PALPATION: Pt has decreased tolerance to palpation, patella appears to be floating with edema superficial and deep. Inc TTP in the L thigh globally  LOWER EXTREMITY ROM: Bil hip ROM WNL  Active ROM Right eval Left eval 07/23/24  Hip flexion     Hip extension     Hip  abduction     Hip adduction     Hip internal rotation     Hip external rotation     Knee flexion WFL 111 132d  Knee extension WFL 5 0d  Ankle dorsiflexion     Ankle plantarflexion     Ankle inversion     Ankle eversion      (Blank rows = not tested)  LOWER EXTREMITY MMT:  MMT Right eval Left eval  Hip flexion 4+/5 3+/5  Hip extension 4+/5 4/5  Hip abduction 4/5 3/5  Hip adduction 5/5 4/5  Hip internal rotation    Hip external rotation    Knee flexion 4+/5 4/5  Knee extension 4+/5 3+/5  Ankle dorsiflexion    Ankle plantarflexion    Ankle inversion    Ankle eversion     (Blank rows = not tested)  LOWER EXTREMITY SPECIAL TESTS:  Knee special tests: Patellafemoral apprehension test: negative, Lateral pull sign: positive , and Patellafemoral grind test: negative Apprehension as well substitution evident in hip extensors  FUNCTIONAL TESTS:  deferred  GAIT: Distance walked: lobby to treatment area Assistive device utilized: None Level of assistance: Complete Independence Comments: Pt has slight delay before initiating steps and first steps show antalgic apprehension with  LLE stance phase. Improves as steps progress but this pattern remains.                                                                                                                                 TREATMENT DATE: [facility] Adult PT Treatment:                                                DATE: 07/23/24 Therapeutic Exercise: Nustep L4 8 min Therapeutic Activity: Assessment of goal progress, ROM and LEFS re-take. FAQs with adduction 15x SAQs 5# 15x Supine hip fallouts GTB 15x B, 15/15 R S/L clams GTB 15x Heel raise from 4 in step  [facility] Adult PT Treatment:                                                DATE: 06/21/24 Therapeutic Exercise: Nustep level 2 x 5 mins Seated LAQ LLE 2x10 #2 Seated hip adduction ball squeeze 5 hold 2x10 Supine heel slide 5 hold 2x15 Supine bridges 2x10 Seated hip  fallouts RTB 2x10 BIL Seated hamstring curl LLE RTB 2x10 Neuromuscular re-ed: TKE against ball on wall 5 hold 2x10 LLE Supine quad set 5 hold 2x10 LLE Supine SLR with quad set x10 LLE SAQ LLE 2x10 2# Therapeutic Activity: STS with LLE back to encourage weight shift onto LLE 2x10  [facility] Adult PT Treatment:                                                DATE: 06/14/24 Therapeutic Exercise: Nustep level 2 x 5 mins while gathering subjective, working on knee flexion ROM Seated LAQ LLE 2x10 Seated hip adduction ball squeeze 5 hold 2x10 Supine heel slide 5 hold 2x15 Supine bridges 2x10 SL clamshells 2x10 Seated hamstring curl LLE RTB 2x10 Neuromuscular re-ed: TKE against ball on wall 5 hold 2x10 LLE Supine quad set 5 hold 2x10 LLE Supine SLR with quad set x10 LLE SAQ LLE 2x10 2# Therapeutic Activity: STS with LLE back to encourage weight shift onto LLE 2x10   [facility] Adult PT Treatment:                                                DATE: 06/01/24 Therapeutic Exercise: Quad set x10 in supine Heel slide x10 in supine Pt edu HEP developed and reviewed   PATIENT EDUCATION:  Education details: Pt educated on relevant  anatomy, physiology, pathology, diagnosis, prognosis, progression of care, pain and activity modification related to L knee pain.  Person educated: Patient Education method: Explanation, Demonstration, and Handouts Education comprehension: verbalized understanding and returned demonstration  HOME EXERCISE PROGRAM: Access Code: [facility] URL: [email or web address] Date: 06/01/2024 Prepared by: Stann Ohara  Exercises - Seated Quad Set  - 3 x daily - 7 x weekly - 1 sets - 10 reps - 2 hold - Supine Heel Slide  - 1 x daily - 7 x weekly - 3 sets - 10 reps - 2 hold - Active Straight Leg Raise with Quad Set  - 1 x daily - 7 x weekly - 1 sets - 10 reps - 2 hold - Side to Side Weight Shift with Counter Support  - 1 x daily - 7 x weekly - 1 sets - 45 reps -  2 hold  ASSESSMENT:  CLINICAL IMPRESSION: Returns for 3rd f/u session since initial assessment.  Focus of visit was progress note addressing progress towards goals and ongoing deficits.  Patient unable to cite distinct aggravating or relieving factors of knee pain.  AROM WNL, poor kinesthetic awareness demonstrated in attempts to perform a quad set, no extensor lag identified.  LEFS score unchanged  EVAL: Patient is a 24 y.o. F who was seen today for physical therapy evaluation and treatment for L knee pain. Symptoms are consistent with L knee dysfunction following a fall onto the knee in July of 2025. Pt has significant limitations in function due to pain. Pt dons support brace most of the time, and feels it helps to provide stability. Sensory and motor deficits present in the knee capsule and thigh, begin to normalize distal to the knee. Symptoms are intermittent in nature and response to provided tasks today consistent with tissue remodeling principles and gradual progression of activity. Pt stands to benefit from continued skilled physical therapy to address deficit areas and restore safety with activities and participations at home and in the community.    OBJECTIVE IMPAIRMENTS: Abnormal gait, decreased activity tolerance, decreased endurance, decreased mobility, difficulty walking, decreased ROM, decreased strength, increased fascial restrictions, impaired perceived functional ability, impaired sensation, and pain.   ACTIVITY LIMITATIONS: carrying, lifting, bending, standing, squatting, stairs, and caring for others  PARTICIPATION LIMITATIONS: cleaning, laundry, community activity, occupation, and yard work  PERSONAL FACTORS: Profession, Time since onset of injury/illness/exacerbation, and 1 comorbidity: Foot and ankle pain are also affecting patient's functional outcome.   REHAB POTENTIAL: Good  CLINICAL DECISION MAKING: Stable/uncomplicated  EVALUATION COMPLEXITY: Low   GOALS: Goals  reviewed with patient? Yes  SHORT TERM GOALS: Target date: 07/01/24   Pt will report compliance with HEP to work towards ind and home management strategies Baseline: [id number], performing 3-4x/week Goal status: Met   2.  Pt will score no less than 55/80 on LEFS to demonstrate improved activity tolerance Baseline: 07/23/24 45 Goal status: Not met   3.  Pt will improve L knee ROM to at least 120 degrees knee flexion and painless in order to demonstrate progress towards activity tolerance and improved function Baseline: see ROM chart 07/23/24  Active ROM Right eval Left eval 07/23/24  Hip flexion     Hip extension     Hip abduction     Hip adduction     Hip internal rotation     Hip external rotation     Knee flexion WFL 111 132d  Knee extension WFL 5 0d   Goal status: Met     LONG TERM GOALS:  Target date: 09/20/24   Pt will score no less than 65/80 on LEFS to demonstrate improved activity tolerance Baseline: 07/23/24 45/80 Goal status: Unchanged   2.  Pt will report no greater than 2/10 pain over 7 consecutive days to demonstrate maintained reduction in symptoms and improved tolerance to activity Baseline: 0/10-9/10; 07/23/24 0-7/10 Goal status: Ongoing   3.  Pt will be ind in the management of their symptoms at home and in the community Baseline: Patient feels she needs more therapy to return to PLOF Goal status: Ongoing   4.  Pt will report being able to climb a flight of stairs without being limited by L knee pain Baseline:  Goal status: Ongoing    PLAN:  PT FREQUENCY: 1x/week  PT DURATION: 4 weeks  PLANNED INTERVENTIONS: [id number]-Therapeutic exercises, 97530- Therapeutic activity, [id number]- Neuromuscular re-education, 97535- Self Care, [id number]- Manual therapy, [phone]- Gait training, [phone]- Electrical stimulation (unattended), [id number]- Vasopneumatic device, 20560 (1-2 muscles), [id number] (3+ muscles)- Dry Needling, Patient/Family education, Cryotherapy, and Moist heat  PLAN FOR  NEXT SESSION: progress L knee ROM, strength, stability through functional movement patterns, update HEP as indicated   Reyes CHRISTELLA Kohut, PT 08/01/2024, 1:17 PM  "

## 2024-08-03 ENCOUNTER — Ambulatory Visit: Payer: MEDICAID

## 2024-08-09 ENCOUNTER — Ambulatory Visit: Payer: MEDICAID | Admitting: Family Medicine

## 2024-08-16 ENCOUNTER — Ambulatory Visit: Payer: MEDICAID | Admitting: Family Medicine
